# Patient Record
Sex: Male | Born: 1960 | Race: White | Hispanic: No | Marital: Married | Smoking: Never smoker
Health system: Southern US, Community
[De-identification: ages and names within clinical notes are randomized; demographics above are authoritative.]

## PROBLEM LIST (undated history)

## (undated) DIAGNOSIS — E669 Obesity, unspecified: Secondary | ICD-10-CM

## (undated) DIAGNOSIS — I10 Essential (primary) hypertension: Secondary | ICD-10-CM

## (undated) HISTORY — DX: Essential (primary) hypertension: I10

## (undated) HISTORY — DX: Obesity, unspecified: E66.9

---

## 1999-11-17 ENCOUNTER — Emergency Department (HOSPITAL_COMMUNITY): Admission: EM | Admit: 1999-11-17 | Discharge: 1999-11-18 | Payer: Self-pay | Admitting: Emergency Medicine

## 2008-01-03 HISTORY — PX: OTHER SURGICAL HISTORY: SHX169

## 2009-05-13 ENCOUNTER — Ambulatory Visit: Payer: Self-pay | Admitting: Family Medicine

## 2009-05-13 DIAGNOSIS — R635 Abnormal weight gain: Secondary | ICD-10-CM

## 2009-05-14 LAB — CONVERTED CEMR LAB
Albumin: 4.7 g/dL (ref 3.5–5.2)
Alkaline Phosphatase: 52 units/L (ref 39–117)
BUN: 20 mg/dL (ref 6–23)
Calcium: 9.1 mg/dL (ref 8.4–10.5)
Creatinine, Ser: 1.09 mg/dL (ref 0.40–1.50)
Glucose, Bld: 93 mg/dL (ref 70–99)
HDL: 35 mg/dL — ABNORMAL LOW (ref >39)
LDL Cholesterol: 133 mg/dL — ABNORMAL HIGH (ref 0–99)
Potassium: 4.1 meq/L (ref 3.5–5.3)
Total CHOL/HDL Ratio: 5.5
Triglycerides: 124 mg/dL (ref <150)

## 2010-02-01 NOTE — Assessment & Plan Note (Signed)
Summary: NOV weight/ labs   Vital Signs:  Patient profile:   50 year old male Height:      71 inches Weight:      265 pounds BMI:     37.09 O2 Sat:      97 % on Room air Pulse rate:   71 / minute BP sitting:   134 / 86  (left arm) Cuff size:   large  Vitals Entered By: Payton Spark CMA (May 13, 2009 9:50 AM)  O2 Flow:  Room air CC: New to est. Discuss weight loss and having fasting labs.    Primary Care Provider:  Seymour Bars DO  CC:  New to est. Discuss weight loss and having fasting labs. .  History of Present Illness: 50 yo WM presents for NOV.  He is previously healthy other than being overwt.  His last CPE was in 07.  His last Tetanus was < 10 yrs ago.  He has not had labs drawn in 4 yrs.  He had a stress test in 2000 that was normal due to fam hx of AMI.  He denies CP or DOE.  he reports a 100 # wt gain over 10 yrs, mostly after having to stop martial arts from a knee injury that required surgery.  he used to be a Optometrist.  Admits to a poor diet, esp with work travel, lack of exercise and a big appetite.    Current Medications (verified): 1)  None  Allergies (verified): No Known Drug Allergies  Past History:  Past Medical History: obese  Past Surgical History: L knee reconstruction 2010- Dr Rennis Chris  Family History: Father AMI at 69, living Mother HTN, anemia MGM DM 2 sisters - overwt  Social History: Art gallery manager for [employer]. Married, no kids. Never smoked. No ETOH. Used to play sports and do [employer]. Poor diet.    Review of Systems       no fevers/sweats/weakness, unexplained wt loss/gain, no change in vision, no difficulty hearing, ringing in ears, no hay fever/allergies, no CP/discomfort, no palpitations, no breast lump/nipple discharge, no cough/wheeze, no blood in stool, no N/V/D, no nocturia, no leaking urine, no unusual vag bleeding, no vaginal/penile discharge, no muscle/joint pain, no rash, no new/changing mole, no HA, no memory loss,  no anxiety, no sleep problem, no depression, no unexplained lumps, no easy bruising/bleeding, no concern with sexual function   Physical Exam  General:  overwt WM in NAD Head:  normocephalic, atraumatic, and no alopecia.   Eyes:  pupils equal, pupils round, and pupils reactive to light.   Nose:  no nasal discharge.   Mouth:  good dentition and pharynx pink and moist.   Neck:  no masses.   Lungs:  Normal respiratory effort, chest expands symmetrically. Lungs are clear to auscultation, no crackles or wheezes. Heart:  Normal rate and regular rhythm. S1 and S2 normal without gallop, murmur, click, rub or other extra sounds. Abdomen:  Bowel sounds positive,abdomen soft and non-tender without masses, organomegaly, no AA bruits Extremities:  no LE edema Neurologic:  gait normal.   Skin:  color normal.   Cervical Nodes:  No lymphadenopathy noted Psych:  good eye contact, not anxious appearing, and not depressed appearing.     Impression & Recommendations:  Problem # 1:  WEIGHT GAIN (ICD-783.1) 100 # wt gain over 10 yrs mostly due to change in lifestyle from being a competitive athlete in martial arts.  We discussed options for exercise that he can due given  his knee pain.  He agrees to start a program of exercise, 1 hr 5 days/ wk as goal and I have given him h/o on low carb, high protein healthy diet to follow.  Update fasting labs.  F/U in 4 wks to see how he is doing.   Orders: T-TSH [id number])  Other Orders: T-Comprehensive Metabolic Panel [id number]) T-Lipid Profile [id number])  Patient Instructions: 1)  Fasting labs today. 2)  Will call you w/ results tomorrow. 3)  Start High Protein Low carb diet.  Avoid snacking, highly sugared foods and processed foods. 4)  Work on getting 1 hr of exercise 5 days/ wk. 5)  Return for f/u weight in 4 wks.

## 2011-03-03 ENCOUNTER — Ambulatory Visit (INDEPENDENT_AMBULATORY_CARE_PROVIDER_SITE_OTHER): Payer: Self-pay | Admitting: Family Medicine

## 2011-03-03 ENCOUNTER — Encounter: Payer: Self-pay | Admitting: Family Medicine

## 2011-03-03 VITALS — BP 155/101 | HR 100 | Ht 71.0 in | Wt 272.0 lb

## 2011-03-03 DIAGNOSIS — E669 Obesity, unspecified: Secondary | ICD-10-CM

## 2011-03-03 DIAGNOSIS — I10 Essential (primary) hypertension: Secondary | ICD-10-CM

## 2011-03-03 MED ORDER — LISINOPRIL-HYDROCHLOROTHIAZIDE 20-12.5 MG PO TABS: 1.0000 | ORAL_TABLET | Freq: Every day | ORAL | Status: DC

## 2011-03-03 NOTE — Progress Notes (Signed)
  Subjective:    Patient ID: Terry Rojas, male    DOB: 29-Aug-1960, 51 y.o.   MRN: [id number]  Hypertension This is a chronic problem. The current episode started more than 1 year ago. The problem has been gradually worsening since onset. The problem is uncontrolled. Pertinent negatives include no chest pain, peripheral edema or shortness of breath. Agents associated with hypertension include NSAIDs. Risk factors for coronary artery disease include male gender, obesity and sedentary lifestyle. Compliance problems include exercise.    Here today bc BP has been high at home and otho office. Hi si likely having surgery on his right shoulder soon. He is to have blood pressure before when he has gained weight but typically will try to get his weight under control and his blood pressure will go down. He admits he is not very active and not exercising currently. He was particularly concerned because his diastolic was 120 the last time he went to see his orthopedist.   Review of Systems  Respiratory: Negative for shortness of breath.   Cardiovascular: Negative for chest pain.       Objective:   Physical Exam  Constitutional: He is oriented to person, place, and time. He appears well-developed and well-nourished.  HENT:  Head: Normocephalic and atraumatic.  Eyes: Conjunctivae are normal. Pupils are equal, round, and reactive to light.  Cardiovascular: Normal rate, regular rhythm and normal heart sounds.        No carotid or abdominal bruits.  Pulmonary/Chest: Effort normal and breath sounds normal.  Neurological: He is alert and oriented to person, place, and time.  Skin: Skin is warm and dry.  Psychiatric: He has a normal mood and affect. His behavior is normal.          Assessment & Plan:  Hypertension - new diagnosis. We will check lab work including a CMP, TSH and lipid panel. He is fasting today. I will then start him on lisinopril HCTZ. Follow up in one month to recheck blood pressure. We  discussed the risks and benefits of the medication including potential side effects. We also discussed the DASH diet and working on a low-salt diet of 1500 mg or less. Regular exercise and weight loss would make a big impact as well.   Obesity-He recently started Weight Watchers a couple weeks ago; this will help as well.

## 2011-03-03 NOTE — Patient Instructions (Signed)
Google the [employer] diet  (nih website)  1.5 Gram Low Sodium Diet A 1.5 gram sodium diet restricts the amount of sodium in the diet to no more than 1.5 g or 1500 mg daily. The American Heart Association recommends Americans over the age of 39 to consume no more than 1500 mg of sodium each day to reduce the risk of developing high blood pressure. Research also shows that limiting sodium may reduce heart attack and stroke risk. Many foods contain sodium for flavor and sometimes as a preservative. When the amount of sodium in a diet needs to be low, it is important to know what to look for when choosing foods and drinks. The following includes some information and guidelines to help make it easier for you to adapt to a low sodium diet. QUICK TIPS  Do not add salt to food.     Avoid convenience items and fast food.     Choose unsalted snack foods.     Buy lower sodium products, often labeled as "lower sodium" or "no salt added."     Check food labels to learn how much sodium is in 1 serving.     When eating at a restaurant, ask that your food be prepared with less salt or none, if possible.  READING FOOD LABELS FOR SODIUM INFORMATION The nutrition facts label is a good place to find how much sodium is in foods. Look for products with no more than 400 mg of sodium per serving. Remember that 1.5 g = 1500 mg. The food label may also list foods as:  Sodium-free: Less than 5 mg in a serving.     Very low sodium: 35 mg or less in a serving.     Low-sodium: 140 mg or less in a serving.     Light in sodium: 50% less sodium in a serving. For example, if a food that usually has 300 mg of sodium is changed to become light in sodium, it will have 150 mg of sodium.     Reduced sodium: 25% less sodium in a serving. For example, if a food that usually has 400 mg of sodium is changed to reduced sodium, it will have 300 mg of sodium.  CHOOSING FOODS Grains  Avoid: Salted crackers and snack items. Some  cereals, including instant hot cereals. Bread stuffing and biscuit mixes. Seasoned rice or pasta mixes.     Choose: Unsalted snack items. Low-sodium cereals, oats, puffed wheat and rice, shredded wheat. English muffins and bread. Pasta.  Meats  Avoid: Salted, canned, smoked, spiced, pickled meats, including fish and poultry. Bacon, ham, sausage, cold cuts, hot dogs, anchovies.     Choose: Low-sodium canned tuna and salmon. Fresh or frozen meat, poultry, and fish.  Dairy  Avoid: Processed cheese and spreads. Cottage cheese. Buttermilk and condensed milk. Regular cheese.     Choose: Milk. Low-sodium cottage cheese. Yogurt. Sour cream. Low-sodium cheese.  Fruits and Vegetables  Avoid: Regular canned vegetables. Regular canned tomato sauce and paste. Frozen vegetables in sauces. Olives. Rosita Fire. Relishes. Sauerkraut.     Choose: Low-sodium canned vegetables. Low-sodium tomato sauce and paste. Frozen or fresh vegetables. Fresh and frozen fruit.  Condiments  Avoid: Canned and packaged gravies. Worcestershire sauce. Tartar sauce. Barbecue sauce. Soy sauce. Steak sauce. Ketchup. Onion, garlic, and table salt. Meat flavorings and tenderizers.     Choose: Fresh and dried herbs and spices. Low-sodium varieties of mustard and ketchup. Lemon juice. Tabasco sauce. Horseradish.  SAMPLE 1.5 GRAM SODIUM MEAL  PLAN Breakfast / Sodium (mg)  1 cup low-fat milk / 143 mg     1 whole-wheat English muffin / 240 mg     1 tbs heart-healthy margarine / 153 mg     1 hard-boiled egg / 139 mg     1 small orange / 0 mg  Lunch / Sodium (mg)  1 cup raw carrots / 76 mg     2 tbs no salt added peanut butter / 5 mg     2 slices whole-wheat bread / 270 mg     1 tbs jelly / 6 mg      cup red grapes / 2 mg  Dinner / Sodium (mg)  1 cup whole-wheat pasta / 2 mg     1 cup low-sodium tomato sauce / 73 mg     3 oz lean ground beef / 57 mg     1 small side salad (1 cup raw spinach leaves,  cup cucumber,   cup yellow bell pepper) with 1 tsp olive oil and 1 tsp red wine vinegar / 25 mg  Snack / Sodium (mg)  1 container low-fat vanilla yogurt / 107 mg     3 graham cracker squares / 127 mg  Nutrient Analysis  Calories: 1745     Protein: 75 g     Carbohydrate: 237 g     Fat: 57 g     Sodium: 1425 mg  Document Released: 12/19/2004 Document Revised: 08/31/2010 Document Reviewed: 03/22/2009 [facility] Patient Information 2012 [place], Peter.

## 2011-03-06 LAB — COMPLETE METABOLIC PANEL WITH GFR
Albumin: 4.7 g/dL (ref 3.5–5.2)
BUN: 26 mg/dL — ABNORMAL HIGH (ref 6–23)
Calcium: 9.4 mg/dL (ref 8.4–10.5)
Chloride: 102 mEq/L (ref 96–112)
Creat: 1.04 mg/dL (ref 0.50–1.35)
GFR, Est Non African American: 84 mL/min
Glucose, Bld: 110 mg/dL — ABNORMAL HIGH (ref 70–99)
Potassium: 4.8 mEq/L (ref 3.5–5.3)

## 2011-03-06 LAB — LIPID PANEL: Cholesterol: 192 mg/dL (ref 0–200)

## 2011-03-14 HISTORY — PX: SHOULDER SURGERY: SHX246

## 2011-03-23 ENCOUNTER — Encounter: Payer: Self-pay | Admitting: *Deleted

## 2011-03-28 ENCOUNTER — Ambulatory Visit: Payer: BC Managed Care – PPO | Admitting: Family Medicine

## 2011-04-17 ENCOUNTER — Encounter: Payer: Self-pay | Admitting: *Deleted

## 2011-04-19 ENCOUNTER — Ambulatory Visit (INDEPENDENT_AMBULATORY_CARE_PROVIDER_SITE_OTHER): Payer: BC Managed Care – PPO | Admitting: Family Medicine

## 2011-04-19 ENCOUNTER — Encounter: Payer: Self-pay | Admitting: Family Medicine

## 2011-04-19 VITALS — BP 130/81 | HR 80 | Ht 71.0 in | Wt 269.0 lb

## 2011-04-19 DIAGNOSIS — I10 Essential (primary) hypertension: Secondary | ICD-10-CM

## 2011-04-19 MED ORDER — LISINOPRIL-HYDROCHLOROTHIAZIDE 20-12.5 MG PO TABS: 1.0000 | ORAL_TABLET | Freq: Every day | ORAL | Status: DC

## 2011-04-19 NOTE — Progress Notes (Signed)
  Subjective:    Patient ID: Terry Rojas, male    DOB: [birth year], 51 y.o.   MRN: [id number]  HPI HTN - BP doing well. Has check it at home.  BP in the 120s at home.  No CP or SOB.  NO SE on the medication. Dry cough initially but has resolved.  He bought the DASH diet book has dropped a few pounds. Just had shoulder surgery about 4 weeks ago.     Review of Systems     Objective:   Physical Exam  Constitutional: He is oriented to person, place, and time. He appears well-developed and well-nourished.  HENT:  Head: Normocephalic and atraumatic.  Cardiovascular: Normal rate, regular rhythm and normal heart sounds.   Pulmonary/Chest: Effort normal and breath sounds normal.  Neurological: He is alert and oriented to person, place, and time.  Skin: Skin is warm and dry.  Psychiatric: He has a normal mood and affect. His behavior is normal.          Assessment & Plan:  HTN- contorlle.d At goal. F/U in 6 months.  Check BMP today.  Doing well. rx sent to [facility]. Continue with dietary changes and exercise and weight loss.   He plans to schedule a CPE this summmer.

## 2011-04-20 LAB — BASIC METABOLIC PANEL
BUN: 22 mg/dL (ref 6–23)
CO2: 29 mEq/L (ref 19–32)
Chloride: 103 mEq/L (ref 96–112)
Glucose, Bld: 103 mg/dL — ABNORMAL HIGH (ref 70–99)
Potassium: 4.6 mEq/L (ref 3.5–5.3)

## 2011-04-20 NOTE — Progress Notes (Signed)
Quick Note:  All labs are normal. ______ 

## 2011-09-01 ENCOUNTER — Encounter: Payer: BC Managed Care – PPO | Admitting: Family Medicine

## 2011-09-09 ENCOUNTER — Other Ambulatory Visit: Payer: Self-pay | Admitting: Family Medicine

## 2011-11-03 ENCOUNTER — Other Ambulatory Visit: Payer: Self-pay | Admitting: Family Medicine

## 2011-11-03 NOTE — Telephone Encounter (Signed)
Needs appointment

## 2011-11-24 ENCOUNTER — Encounter: Payer: BC Managed Care – PPO | Admitting: Family Medicine

## 2011-11-24 DIAGNOSIS — Z0289 Encounter for other administrative examinations: Secondary | ICD-10-CM

## 2012-09-12 ENCOUNTER — Encounter: Payer: Self-pay | Admitting: Family Medicine

## 2012-09-12 ENCOUNTER — Ambulatory Visit (INDEPENDENT_AMBULATORY_CARE_PROVIDER_SITE_OTHER): Payer: BC Managed Care – PPO | Admitting: Family Medicine

## 2012-09-12 VITALS — BP 147/87 | HR 95 | Wt 282.0 lb

## 2012-09-12 DIAGNOSIS — I10 Essential (primary) hypertension: Secondary | ICD-10-CM

## 2012-09-12 DIAGNOSIS — Z1211 Encounter for screening for malignant neoplasm of colon: Secondary | ICD-10-CM

## 2012-09-12 DIAGNOSIS — Z6839 Body mass index (BMI) 39.0-39.9, adult: Secondary | ICD-10-CM

## 2012-09-12 MED ORDER — LISINOPRIL-HYDROCHLOROTHIAZIDE 20-12.5 MG PO TABS: ORAL_TABLET | ORAL | Status: DC

## 2012-09-12 NOTE — Progress Notes (Signed)
  Subjective:    Patient ID: Terry Rojas, male    DOB: [birth year], 52 y.o.   MRN: [id number]  HPI HTN -  Pt denies chest pain, SOB, dizziness, or heart palpitations.  Taking meds as directed w/o problems.  Denies medication side effects.  Plans on staring a workout program.  He has a new job and has been much less stressful but he does travel a lot which consist diet is often very poor.  BPs at home running 120-150s. No swelling  Review of Systems  BP 147/87  Pulse 95  Wt 282 lb (127.914 kg)  BMI 39.35 kg/m2    No Known Allergies  Past Medical History  Diagnosis Date  . Obese     Past Surgical History  Procedure Laterality Date  . Left knee reconstruction  2010    Dr. Rennis Chris  . Shoulder surgery  03/14/2011    left, meniscal tear repair.     History   Social History  . Marital Status: Single    Spouse Name: N/A    Number of Children: N/A  . Years of Education: N/A   Occupational History  . Not on file.   Social History Main Topics  . Smoking status: Never Smoker   . Smokeless tobacco: Not on file  . Alcohol Use: No  . Drug Use: No  . Sexual Activity:    Other Topics Concern  . Not on file   Social History Narrative  . No narrative on file    Family History  Problem Relation Age of Onset  . Coronary artery disease Father 64    6 vessel bypass  . Heart disease Father 24  . Hypertension Mother   . Anemia Mother   . Diabetes Maternal Grandmother     Outpatient Encounter Prescriptions as of 09/12/2012  Medication Sig Dispense Refill  . lisinopril-hydrochlorothiazide (PRINZIDE,ZESTORETIC) 20-12.5 MG per tablet One po BID  60 tablet  3  . [DISCONTINUED] lisinopril-hydrochlorothiazide (PRINZIDE,ZESTORETIC) 20-12.5 MG per tablet TAKE 1 TABLET DAILY (NEED APPOINTMENT)  30 tablet  0   No facility-administered encounter medications on file as of 09/12/2012.          Objective:   Physical Exam  Constitutional: He is oriented to person, place, and time. He  appears well-developed and well-nourished.  HENT:  Head: Normocephalic and atraumatic.  Cardiovascular: Normal rate, regular rhythm and normal heart sounds.   Pulmonary/Chest: Effort normal and breath sounds normal.  Neurological: He is alert and oriented to person, place, and time.  Skin: Skin is warm and dry.  Psychiatric: He has a normal mood and affect. His behavior is normal.          Assessment & Plan:  HTN - uncontrolled.  Discussed low salt diet, weight loss and regular exercise.  Increase med to BID. F/U in 6 weeks to make sure well controlled.   Obesity-he says is the most she has ever weighed. He does plan on starting a workout program with a personal trainer and is anxious or tracking his diet as of today.  Need for screenign colonoscopy. Discussed options.  He is okay with getting a screening colonoscopy. Referral placed.  Due for Flu vaccine.  He says he typically gets it through his wife's work.

## 2012-09-13 ENCOUNTER — Ambulatory Visit: Payer: BC Managed Care – PPO | Admitting: Family Medicine

## 2012-10-29 LAB — COMPLETE METABOLIC PANEL WITH GFR
AST: 31 U/L (ref 0–37)
Albumin: 4.3 g/dL (ref 3.5–5.2)
BUN: 16 mg/dL (ref 6–23)
Calcium: 9.3 mg/dL (ref 8.4–10.5)
Chloride: 102 mEq/L (ref 96–112)
Glucose, Bld: 115 mg/dL — ABNORMAL HIGH (ref 70–99)
Potassium: 4.2 mEq/L (ref 3.5–5.3)

## 2012-10-29 LAB — LIPID PANEL
Cholesterol: 190 mg/dL (ref 0–200)
LDL Cholesterol: 119 mg/dL — ABNORMAL HIGH (ref 0–99)
VLDL: 32 mg/dL (ref 0–40)

## 2012-11-01 ENCOUNTER — Ambulatory Visit (INDEPENDENT_AMBULATORY_CARE_PROVIDER_SITE_OTHER): Payer: BC Managed Care – PPO | Admitting: Family Medicine

## 2012-11-01 ENCOUNTER — Encounter: Payer: Self-pay | Admitting: Family Medicine

## 2012-11-01 VITALS — BP 146/95 | HR 95 | Ht 71.0 in | Wt 283.0 lb

## 2012-11-01 DIAGNOSIS — R7301 Impaired fasting glucose: Secondary | ICD-10-CM

## 2012-11-01 DIAGNOSIS — R7309 Other abnormal glucose: Secondary | ICD-10-CM

## 2012-11-01 DIAGNOSIS — I10 Essential (primary) hypertension: Secondary | ICD-10-CM

## 2012-11-01 LAB — BASIC METABOLIC PANEL WITH GFR
BUN: 21 mg/dL (ref 6–23)
Chloride: 103 mEq/L (ref 96–112)
GFR, Est Non African American: 80 mL/min
Potassium: 4.1 mEq/L (ref 3.5–5.3)

## 2012-11-01 LAB — POCT GLYCOSYLATED HEMOGLOBIN (HGB A1C): Hemoglobin A1C: 5.6

## 2012-11-01 NOTE — Patient Instructions (Signed)
Diets for Diabetes, Food Labeling Look at food labels to help you decide how much of a product you can eat. You will want to check the amount of total carbohydrate in a serving to see how the food fits into your meal plan. In the list of ingredients, the ingredient present in the largest amount by weight must be listed first, followed by the other ingredients in descending order. STANDARD OF IDENTITY Most products have a list of ingredients. However, foods that the Food and Drug Administration (FDA) has given a standard of identity do not need a list of ingredients. A standard of identity means that a food must contain certain ingredients if it is called a particular name. Examples are mayonnaise, peanut butter, ketchup, jelly, and cheese. LABELING TERMS There are many terms found on food labels. Some of these terms have specific definitions. Some terms are regulated by the FDA, and the FDA has clearly specified how they can be used. Others are not regulated or well-defined and can be misleading and confusing. SPECIFICALLY DEFINED TERMS Nutritive Sweetener.  A sweetener that contains calories,such as table sugar or honey. Nonnutritive Sweetener.  A sweetener with few or no calories,such as saccharin, aspartame, sucralose, and cyclamate. LABELING TERMS REGULATED BY THE FDA Free.  The product contains only a tiny or small amount of fat, cholesterol, sodium, sugar, or calories. For example, a "fat-free" product will contain less than 0.5 g of fat per serving. Low.  A food described as "low" in fat, saturated fat, cholesterol, sodium, or calories could be eaten fairly often without exceeding dietary guidelines. For example, "low in fat" means no more than 3 g of fat per serving. Lean.  "Lean" and "extra lean" are U.S. Department of Agriculture (USDA) terms for use on meat and poultry products. "Lean" means the product contains less than 10 g of fat, 4 g of saturated fat, and 95 mg of cholesterol  per serving. "Lean" is not as low in fat as a product labeled "low." Extra Lean.  "Extra lean" means the product contains less than 5 g of fat, 2 g of saturated fat, and 95 mg of cholesterol per serving. While "extra lean" has less fat than "lean," it is still higher in fat than a product labeled "low." Reduced, Less, Fewer.  A diet product that contains 25% less of a nutrient or calories than the regular version. For example, hot dogs might be labeled "25% less fat than our regular hot dogs." Light/Lite.  A diet product that contains  fewer calories or  the fat of the original. For example, "light in sodium" means a product with  the usual sodium. More.  One serving contains at least 10% more of the daily value of a vitamin, mineral, or fiber than usual. Good Source Of.  One serving contains 10% to 19% of the daily value for a particular vitamin, mineral, or fiber. Excellent Source Of.  One serving contains 20% or more of the daily value for a particular nutrient. Other terms used might be "high in" or "rich in." Enriched or Fortified.  The product contains added vitamins, minerals, or protein. Nutrition labeling must be used on enriched or fortified foods. Imitation.  The product has been altered so that it is lower in protein, vitamins, or minerals than the usual food,such as imitation peanut butter. Total Fat.  The number listed is the total of all fat found in a serving of the product. Under total fat, food labels must list saturated fat and   trans fat, which are associated with raising bad cholesterol and an increased risk of heart blood vessel disease. Saturated Fat.  Mainly fats from animal-based sources. Some examples are red meat, cheese, cream, whole milk, and coconut oil. Trans Fat.  Found in some fried snack foods, packaged foods, and fried restaurant foods. It is recommended you eat as close to 0 g of trans fat as possible, since it raises bad cholesterol and lowers  good cholesterol. Polyunsaturated and Monounsaturated Fats.  More healthful fats. These fats are from plant sources. Total Carbohydrate.  The number of carbohydrate grams in a serving of the product. Under total carbohydrate are listed the other carbohydrate sources, such as dietary fiber and sugars. Dietary Fiber.  A carbohydrate from plant sources. Sugars.  Sugars listed on the label contain all naturally occurring sugars as well as added sugars. LABELING TERMS NOT REGULATED BY THE FDA Sugarless.  Table sugar (sucrose) has not been added. However, the manufacturer may use another form of sugar in place of sucrose to sweeten the product. For example, sugar alcohols are used to sweeten foods. Sugar alcohols are a form of sugar but are not table sugar. If a product contains sugar alcohols in place of sucrose, it can still be labeled "sugarless." Low Salt, Salt-Free, Unsalted, No Salt, No Salt Added, Without Added Salt.  Food that is usually processed with salt has been made without salt. However, the food may contain sodium-containing additives, such as preservatives, leavening agents, or flavorings. Natural.  This term has no legal meaning. Organic.  Foods that are certified as organic have been inspected and approved by the USDA to ensure they are produced without pesticides, fertilizers containing synthetic ingredients, bioengineering, or ionizing radiation. Document Released: 12/22/2002 Document Revised: 03/13/2011 Document Reviewed: 07/09/2008 ExitCare Patient Information 2014 [place], [place]. 1800 Calorie Diet for Diabetes Meal Planning The 1800 calorie diet is designed for eating up to 1800 calories each day. Following this diet and making healthy meal choices can help improve overall health. This diet controls blood sugar (glucose) levels and can also help lower blood pressure and cholesterol. SERVING SIZES Measuring foods and serving sizes helps to make sure you are getting the  right amount of food. The list below tells how big or small some common serving sizes are:  1 oz.........4 stacked dice.  3 oz........Marland KitchenDeck of cards.  1 tsp.......Marland KitchenTip of little finger.  1 tbs......Marland KitchenMarland KitchenThumb.  2 tbs.......Marland KitchenGolf ball.   cup......Marland KitchenHalf of a fist.  1 cup.......Marland KitchenA fist. GUIDELINES FOR CHOOSING FOODS The goal of this diet is to eat a variety of foods and limit calories to 1800 each day. This can be done by choosing foods that are low in calories and fat. The diet also suggests eating small amounts of food frequently. Doing this helps control your blood glucose levels so they do not get too high or too low. Each meal or snack may include a protein food source to help you feel more satisfied and to stabilize your blood glucose. Try to eat about the same amount of food around the same time each day. This includes weekend days, travel days, and days off work. Space your meals about 4 to 5 hours apart and add a snack between them if you wish.  For example, a daily food plan could include breakfast, a morning snack, lunch, dinner, and an evening snack. Healthy meals and snacks include whole grains, vegetables, fruits, lean meats, poultry, fish, and dairy products. As you plan your meals, select a variety of foods.  Choose from the bread and starch, vegetable, fruit, dairy, and meat/protein groups. Examples of foods from each group and their suggested serving sizes are listed below. Use measuring cups and spoons to become familiar with what a healthy portion looks like. Bread and Starch Each serving equals 15 grams of carbohydrates.  1 slice bread.   bagel.   cup cold cereal (unsweetened).   cup hot cereal or mashed potatoes.  1 small potato (size of a computer mouse).   cup cooked pasta or rice.   English muffin.  1 cup broth-based soup.  3 cups of popcorn.  4 to 6 whole-wheat crackers.   cup cooked beans, peas, or corn. Vegetable Each serving equals 5 grams of  carbohydrates.   cup cooked vegetables.  1 cup raw vegetables.   cup tomato or vegetable juice. Fruit Each serving equals 15 grams of carbohydrates.  1 small apple or orange.  1 cup watermelon or strawberries.   cup applesauce (no sugar added).  2 tbs raisins.   banana.   cup canned fruit, packed in water, its own juice, or sweetened with a sugar substitute.   cup unsweetened fruit juice. Dairy Each serving equals 12 to 15 grams of carbohydrates.  1 cup fat-free milk.  6 oz artificially sweetened yogurt or plain yogurt.  1 cup low-fat buttermilk.  1 cup soy milk.  1 cup almond milk. Meat/Protein  1 large egg.  2 to 3 oz meat, poultry, or fish.   cup low-fat cottage cheese.  1 tbs peanut butter.  1 oz low-fat cheese.   cup tuna in water.   cup tofu. Fat  1 tsp oil.  1 tsp trans-fat-free margarine.  1 tsp butter.  1 tsp mayonnaise.  2 tbs avocado.  1 tbs salad dressing.  1 tbs cream cheese.  2 tbs sour cream. SAMPLE 1800 CALORIE DIET PLAN Breakfast   cup unsweetened cereal (1 carb serving).  1 cup fat-free milk (1 carb serving).  1 slice whole-wheat toast (1 carb serving).   small banana (1 carb serving).  1 scrambled egg.  1 tsp trans-fat-free margarine. Lunch  Tuna sandwich.  2 slices whole-wheat bread (2 carb servings).   cup canned tuna in water, drained.  1 tbs reduced fat mayonnaise.  1 stalk celery, chopped.  2 slices tomato.  1 lettuce leaf.  1 cup carrot sticks.  24 to 30 seedless grapes (2 carb servings).  6 oz light yogurt (1 carb serving). Afternoon Snack  3 graham cracker squares (1 carb serving).  Fat-free milk, 1 cup (1 carb serving).  1 tbs peanut butter. Dinner  3 oz salmon, broiled with 1 tsp oil.  1 cup mashed potatoes (2 carb servings) with 1 tsp trans-fat-free margarine.  1 cup fresh or frozen green beans.  1 cup steamed asparagus.  1 cup fat-free milk (1 carb  serving). Evening Snack  3 cups air-popped popcorn (1 carb serving).  2 tbs parmesan cheese sprinkled on top. MEAL PLAN Use this worksheet to help you make a daily meal plan based on the 1800 calorie diet suggestions. If you are using this plan to help you control your blood glucose, you may interchange carbohydrate-containing foods (dairy, starches, and fruits). Select a variety of fresh foods of varying colors and flavors. The total amount of carbohydrate in your meals or snacks is more important than making sure you include all of the food groups every time you eat. Choose from the following foods to build your day's meals:  8  Starches.  4 Vegetables.  3 Fruits.  2 Dairy.  6 to 7 oz Meat/Protein.  Up to 4 Fats. Your dietician can use this worksheet to help you decide how many servings and which types of foods are right for you. BREAKFAST Food Group and Servings / Food Choice Starch ________________________________________________________ Dairy _________________________________________________________ Fruit _________________________________________________________ Meat/Protein __________________________________________________ Fat ___________________________________________________________ LUNCH Food Group and Servings / Food Choice Starch ________________________________________________________ Meat/Protein __________________________________________________ Vegetable _____________________________________________________ Fruit _________________________________________________________ Dairy _________________________________________________________ Fat ___________________________________________________________ Aura Fey Food Group and Servings / Food Choice Starch ________________________________________________________ Meat/Protein __________________________________________________ Fruit __________________________________________________________ Dairy  _________________________________________________________ Laural Golden Food Group and Servings / Food Choice Starch _________________________________________________________ Meat/Protein ___________________________________________________ Dairy __________________________________________________________ Vegetable ______________________________________________________ Fruit ___________________________________________________________ Fat ____________________________________________________________ Lollie Sails Food Group and Servings / Food Choice Fruit __________________________________________________________ Meat/Protein ___________________________________________________ Dairy __________________________________________________________ Starch _________________________________________________________ DAILY TOTALS Starch ____________________________ Vegetable _________________________ Fruit _____________________________ Dairy _____________________________ Meat/Protein______________________ Fat _______________________________ Document Released: 07/11/2004 Document Revised: 03/13/2011 Document Reviewed: 11/04/2010 ExitCare Patient Information 2014 [place] [employer].

## 2012-11-01 NOTE — Progress Notes (Signed)
  Subjective:    Patient ID: Terry Rojas, male    DOB: 1960-02-13, 52 y.o.   MRN: [id number]  HPI HTN -  Pt denies chest pain, SOB, dizziness, or heart palpitations.  Taking meds as directed w/o problems.  Denies medication side effects. Using an omron cuff.  Home BPs running under 140/90.    pt took bp this am it was 135/88   Abnormal glucose: Blastoma did blood work on him his glucose was elevated. He has not had any symptoms of polyuria or polydipsia. Never had any palms of his glucose in the past. He does admit he has been eating a little bit more carb heavy and cleaner protein diet lately.  Review of Systems     Objective:   Physical Exam  Constitutional: He is oriented to person, place, and time. He appears well-developed and well-nourished.  HENT:  Head: Normocephalic and atraumatic.  Cardiovascular: Normal rate, regular rhythm and normal heart sounds.   Pulmonary/Chest: Effort normal and breath sounds normal.  Neurological: He is alert and oriented to person, place, and time.  Skin: Skin is warm and dry.  Psychiatric: He has a normal mood and affect. His behavior is normal.          Assessment & Plan:  HTN - well controlled. F/Uin 6 months.  Had cough initially but now resolved. Check BMP today to check on creatinine and renal function since he does adjust his lisinopril HCT last time.  Impaired fasting glucose-new diagnosis. Discussed his diagnosis and what that means. Discussed avoiding concentrated sweets and cutting back on portion sizes and carbohydrates. Handout given. Also encouraged him to go on to the American diabetic Association website for additional nutritional information. We'll see him back in 6 months and we can follow his A1c carefully. Continue with his diet changes as well as exercise. He says he plans on starting to use my fitness PAL to help him continue to lose weight.

## 2012-11-01 NOTE — Progress Notes (Signed)
Quick Note:  All labs are normal. ______ 

## 2012-11-01 NOTE — Addendum Note (Signed)
Addended by: Deno Etienne on: 11/01/2012 09:46 AM   Modules accepted: Orders

## 2013-03-11 ENCOUNTER — Other Ambulatory Visit: Payer: Self-pay | Admitting: Family Medicine

## 2013-04-17 ENCOUNTER — Other Ambulatory Visit: Payer: Self-pay | Admitting: Family Medicine

## 2013-04-25 ENCOUNTER — Encounter: Payer: Self-pay | Admitting: Family Medicine

## 2013-04-25 ENCOUNTER — Ambulatory Visit (INDEPENDENT_AMBULATORY_CARE_PROVIDER_SITE_OTHER): Payer: BC Managed Care – PPO | Admitting: Family Medicine

## 2013-04-25 VITALS — BP 140/94 | HR 65 | Ht 71.0 in | Wt 290.0 lb

## 2013-04-25 DIAGNOSIS — G4710 Hypersomnia, unspecified: Secondary | ICD-10-CM

## 2013-04-25 DIAGNOSIS — R0989 Other specified symptoms and signs involving the circulatory and respiratory systems: Secondary | ICD-10-CM

## 2013-04-25 DIAGNOSIS — I10 Essential (primary) hypertension: Secondary | ICD-10-CM

## 2013-04-25 DIAGNOSIS — G4730 Sleep apnea, unspecified: Secondary | ICD-10-CM

## 2013-04-25 DIAGNOSIS — R0683 Snoring: Secondary | ICD-10-CM

## 2013-04-25 DIAGNOSIS — R0609 Other forms of dyspnea: Secondary | ICD-10-CM

## 2013-04-25 MED ORDER — LISINOPRIL-HYDROCHLOROTHIAZIDE 20-12.5 MG PO TABS: ORAL_TABLET | ORAL | Status: DC

## 2013-04-25 NOTE — Progress Notes (Signed)
STOP BANG: pt answered yes to all 8 questions. He is High risk for OSA.Terry Rojas L Terry Rojas

## 2013-04-25 NOTE — Progress Notes (Signed)
   Subjective:    Patient ID: Terry Rojas, male    DOB: 07/22/1960, 53 y.o.   MRN: [id number]  HPI Thinks may have sleep apnea.  He has been commuting back and forth to [place] as has had to stop and take naps.  .Snores nightly . Has been very fatigued. He has been gaining weight.    His wife has witnessed apneas. He has several family members that are not formally diagnosed with sleep apnea but he thinks they may have it.  Went to a bariatric seminar about a year ago. At that time he wasn't ready to commit to but now has decided he really would like to consider undergoing bariatric surgery. He just feels very uncomfortable in his body. Not able to exercise like he would like. 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 His wife is here with him for the visit today. He did check with your insurance and they will need to have 6 office visits for weight loss before he can be considered.  Hypertension- Pt denies chest pain, SOB, dizziness, or heart palpitations.  Taking meds as directed w/o problems.  Denies medication side effects.     Review of Systems     Objective:   Physical Exam  Constitutional: He is oriented to person, place, and time. He appears well-developed and well-nourished.  HENT:  Head: Normocephalic and atraumatic.  Eyes: Conjunctivae are normal. Pupils are equal, round, and reactive to light.  Neck: Neck supple. No thyromegaly present.  Cardiovascular: Normal rate, regular rhythm and normal heart sounds.   Pulmonary/Chest: Effort normal and breath sounds normal.  Musculoskeletal: He exhibits no edema.  Lymphadenopathy:    He has no cervical adenopathy.  Neurological: He is alert and oriented to person, place, and time.  Skin: Skin is warm and dry.  Psychiatric: He has a normal mood and affect. His behavior is normal.          Assessment & Plan:  Snoring-strong suspect he has sleep apnea. His stopping  questionnaire was positive for every question. [facility] Once we get the results and he will need to come back in to discuss those with an office visit. In the meantime make sure he is driving safely.  Obesity/BMI of 40-am happy to refer him for bariatric surgery. I did actually think he would be a good candidate. As far as the 6 months of monitor weight loss that can certainly be done at the bariatric clinic or here in our office. I be happy to do that for him.  Hypertension-mildly elevated not maximally controlled today. Usually his blood pressure looks great. I'm going to see him back hopefully in about one month after his sleep study so we can recheck his pressure at that time and make adjustments as needed. Refills were sent to the pharmacy today. I think also treating his sleep apnea will make a big difference in controlling his blood pressure as well.  Time spent 25 minutes, crit to person the time spent counseling about possible sleep apnea, obesity and blood pressure.

## 2013-04-29 ENCOUNTER — Ambulatory Visit: Payer: BC Managed Care – PPO | Admitting: Family Medicine

## 2013-05-08 ENCOUNTER — Telehealth: Payer: Self-pay | Admitting: *Deleted

## 2013-05-08 NOTE — Telephone Encounter (Signed)
lvm informing pt that he will need to call the Bariatric Seminar to begin his bariatric process.Deno Etienneonya L Anasha Perfecto

## 2013-05-08 NOTE — Telephone Encounter (Signed)
Message copied by Deno EtienneBARKLEY, Girl Schissler L on Thu May 08, 2013  9:43 AM ------      Message from: Kerrin ChampagneLOWERY, TENA M      Created: Fri Apr 25, 2013  4:42 PM       This pt needs to call the Bariatric Seminar at [phone] to start the process before we can schedule an appt. Please call me if any questions.      Tena ------

## 2013-05-30 ENCOUNTER — Other Ambulatory Visit: Payer: Self-pay | Admitting: Family Medicine

## 2013-07-07 ENCOUNTER — Other Ambulatory Visit: Payer: Self-pay | Admitting: Family Medicine

## 2013-07-07 NOTE — Telephone Encounter (Signed)
30 day refill sent to pharmacy with comment to schedule appt.

## 2013-08-10 ENCOUNTER — Other Ambulatory Visit: Payer: Self-pay | Admitting: Family Medicine

## 2013-08-17 ENCOUNTER — Encounter (HOSPITAL_BASED_OUTPATIENT_CLINIC_OR_DEPARTMENT_OTHER): Payer: BC Managed Care – PPO

## 2013-10-04 ENCOUNTER — Other Ambulatory Visit: Payer: Self-pay | Admitting: Family Medicine

## 2013-10-25 ENCOUNTER — Other Ambulatory Visit: Payer: Self-pay | Admitting: Family Medicine

## 2013-12-04 ENCOUNTER — Ambulatory Visit: Payer: Self-pay | Admitting: Specialist

## 2013-12-07 ENCOUNTER — Other Ambulatory Visit: Payer: Self-pay | Admitting: Family Medicine

## 2014-01-02 HISTORY — PX: BARIATRIC SURGERY: SHX1103

## 2014-01-19 ENCOUNTER — Other Ambulatory Visit: Payer: Self-pay | Admitting: Family Medicine

## 2014-01-19 NOTE — Telephone Encounter (Signed)
Pt needs appt before future refills.

## 2014-02-17 ENCOUNTER — Other Ambulatory Visit: Payer: Self-pay | Admitting: Family Medicine

## 2014-03-18 ENCOUNTER — Other Ambulatory Visit: Payer: Self-pay | Admitting: Family Medicine

## 2014-03-18 NOTE — Telephone Encounter (Signed)
Pt needs an office visit before further refills

## 2014-03-26 ENCOUNTER — Encounter: Payer: Self-pay | Admitting: Family Medicine

## 2014-03-26 DIAGNOSIS — Z9884 Bariatric surgery status: Secondary | ICD-10-CM | POA: Insufficient documentation

## 2014-04-17 ENCOUNTER — Other Ambulatory Visit: Payer: Self-pay | Admitting: Family Medicine

## 2014-05-17 ENCOUNTER — Other Ambulatory Visit: Payer: Self-pay | Admitting: Family Medicine

## 2015-04-12 IMAGING — CR DG CHEST 2V
1 series · 2 of 2 positions shown · non-contrast
Comparison: None.

CLINICAL DATA: Obesity, preop for gastric sleeve

EXAM:
CHEST  2 VIEW

[Series 1: dxr chest pa (or ap) and lateral · 0.14mm/px · 2 of 2 slices shown]
[im 1/2]
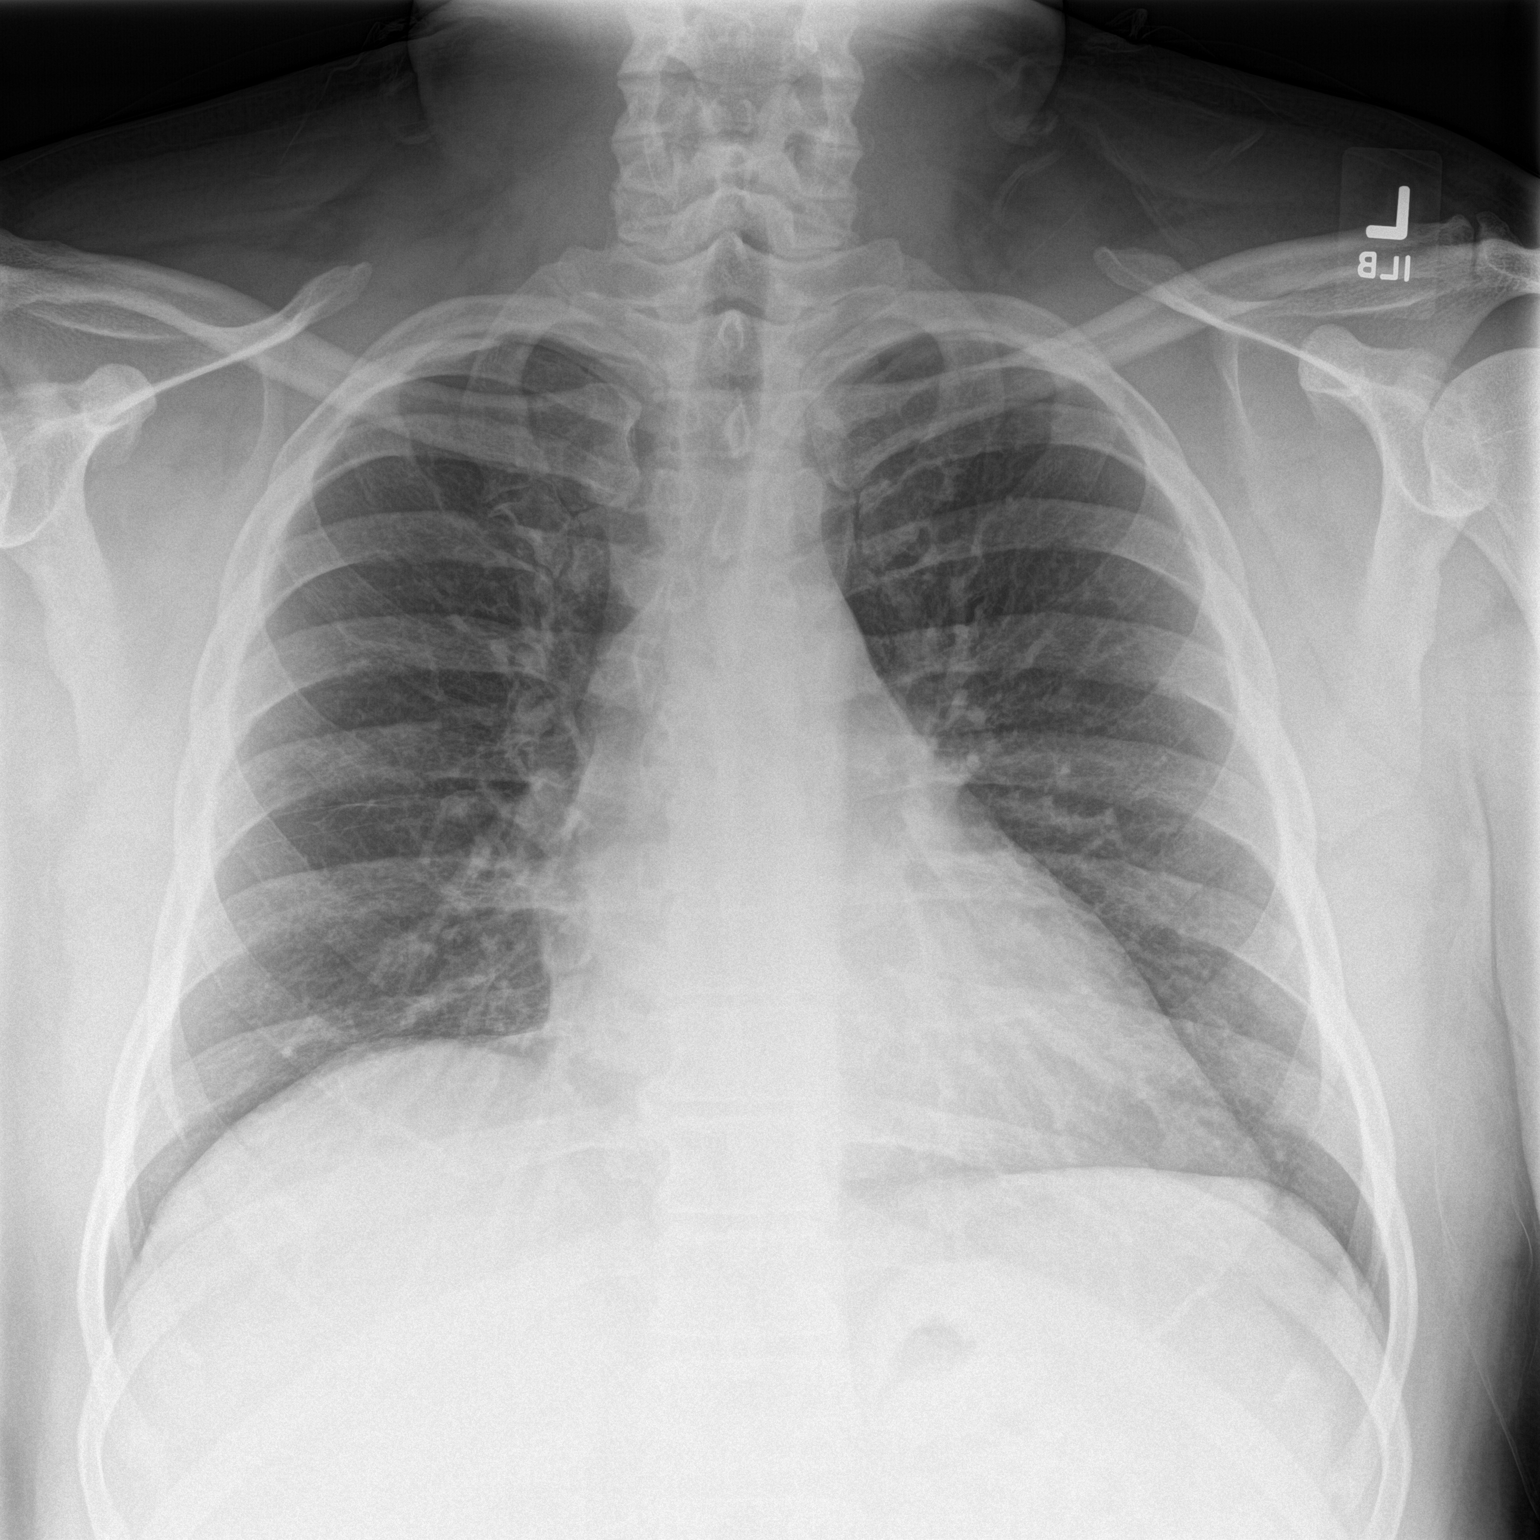
[im 2/2]
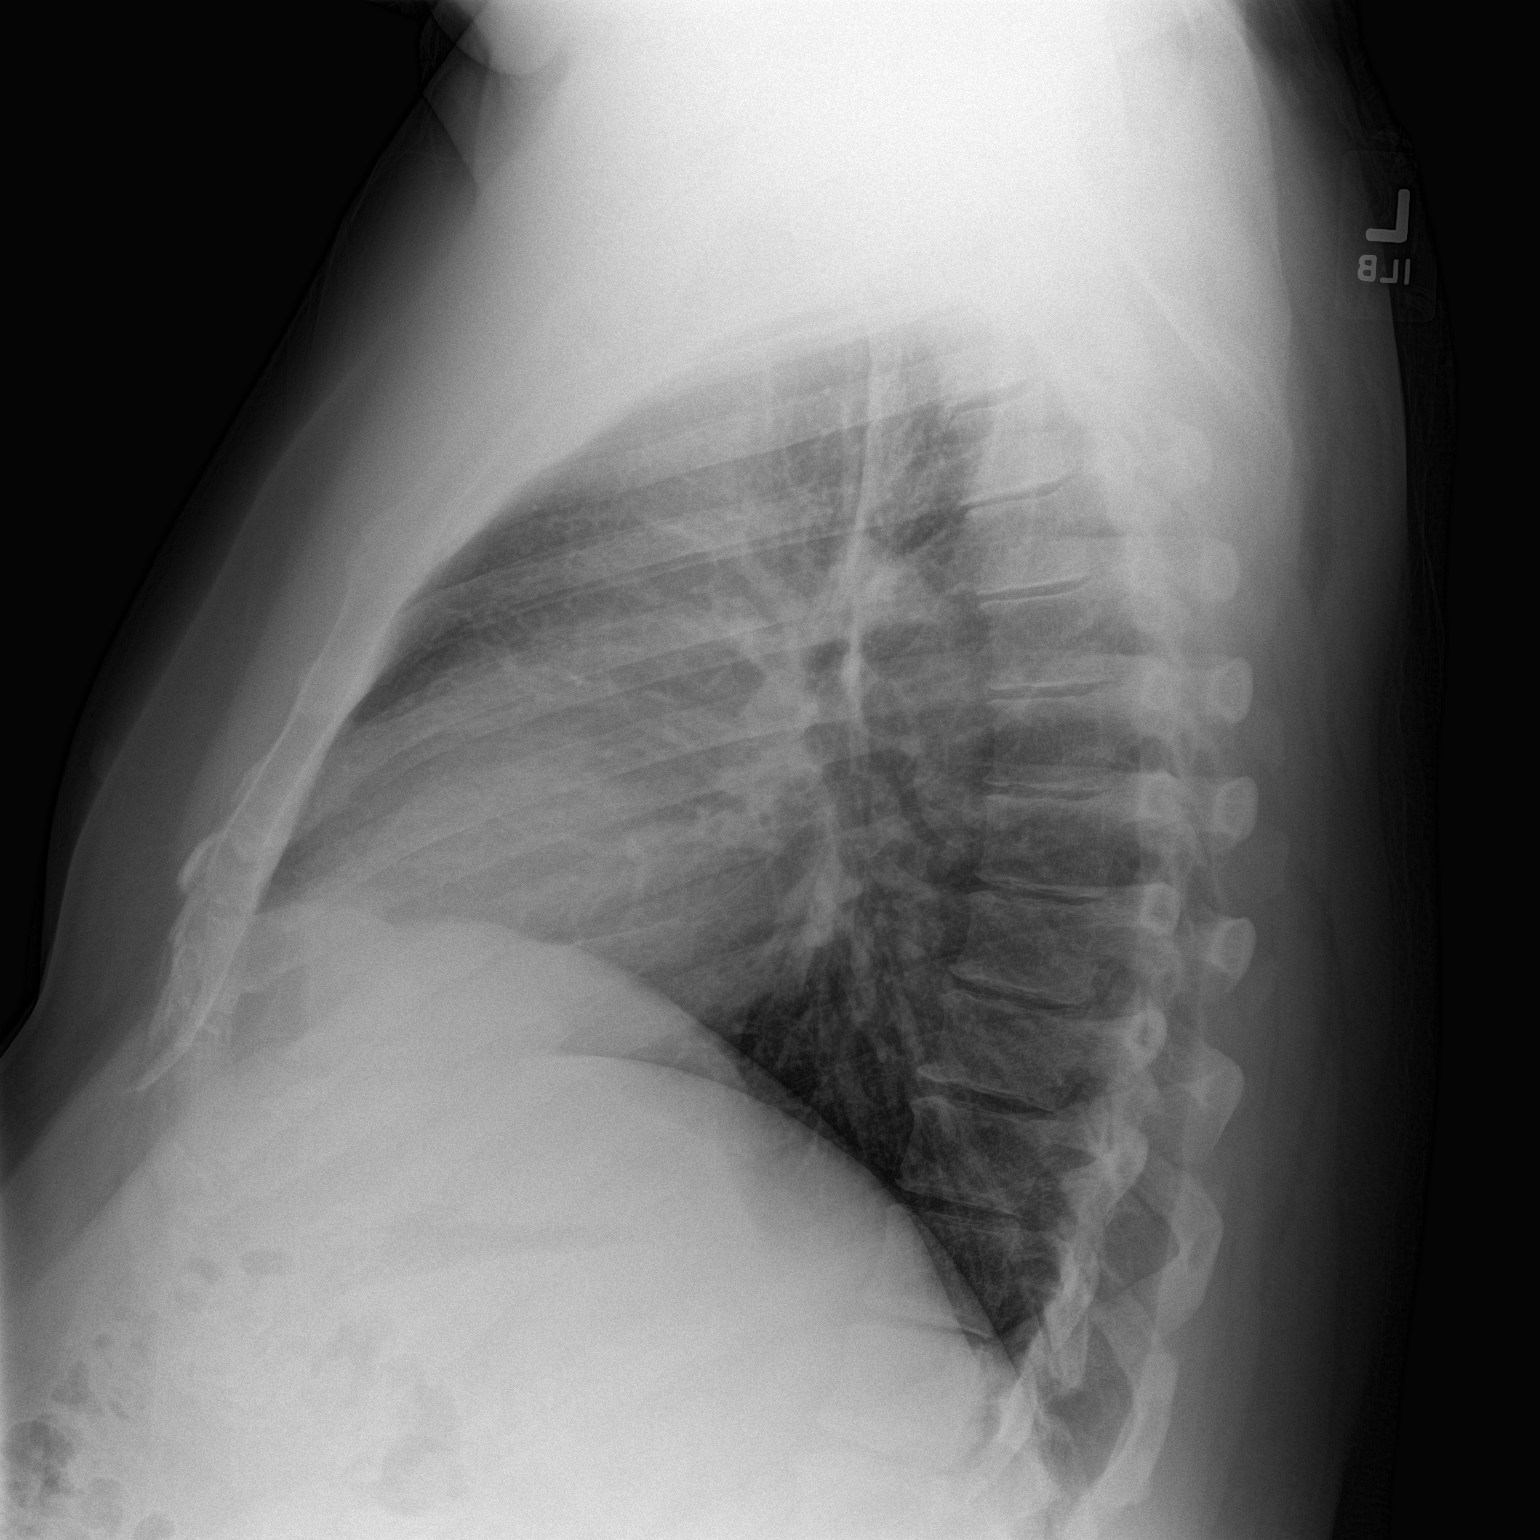

[2 of 2 positions shown; findings below may reference images not displayed]

FINDINGS: Cardiomediastinal silhouette is unremarkable. No acute infiltrate or
pleural effusion. No pulmonary edema. Mild elevation of the right
hemidiaphragm.
IMPRESSION: No active cardiopulmonary disease.

## 2015-04-12 IMAGING — US ABDOMEN ULTRASOUND LIMITED
1 series · 14 of 25 positions shown · non-contrast
Comparison: None.

CLINICAL DATA: Obesity, preop clearance

EXAM:
US ABDOMEN LIMITED - RIGHT UPPER QUADRANT

[Series 1: abdomen ultrasound limited · 0.33mm/px · 14 of 44 slices shown]
[im 1/44]
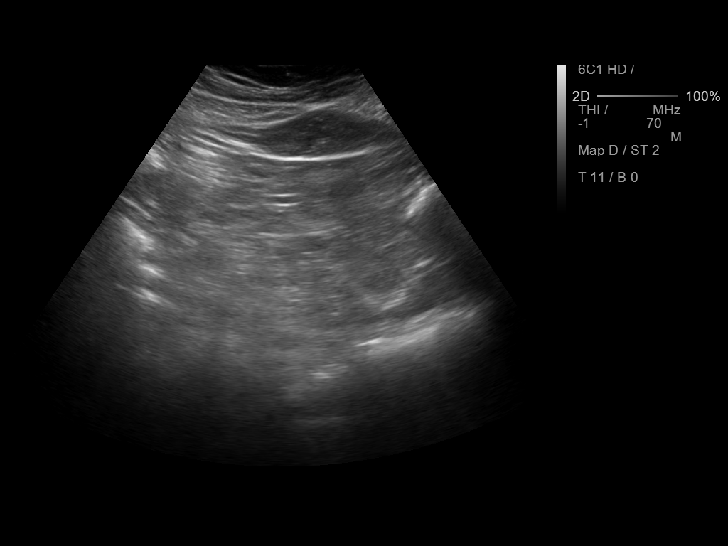
[im 4/44]
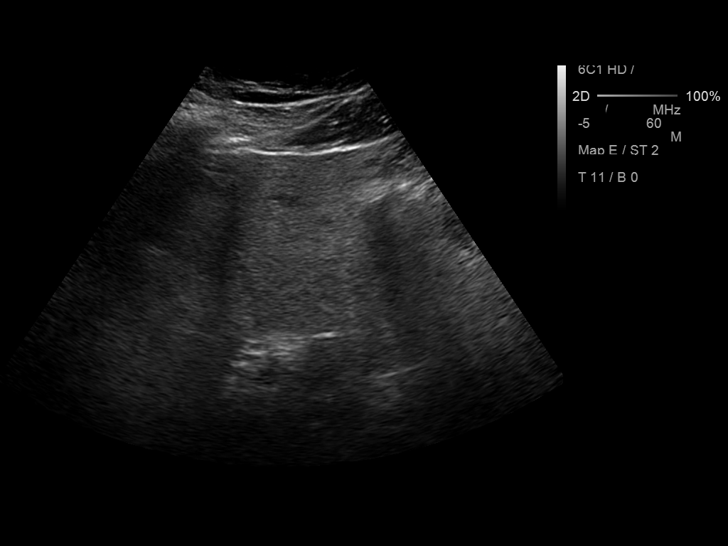
[im 8/44]
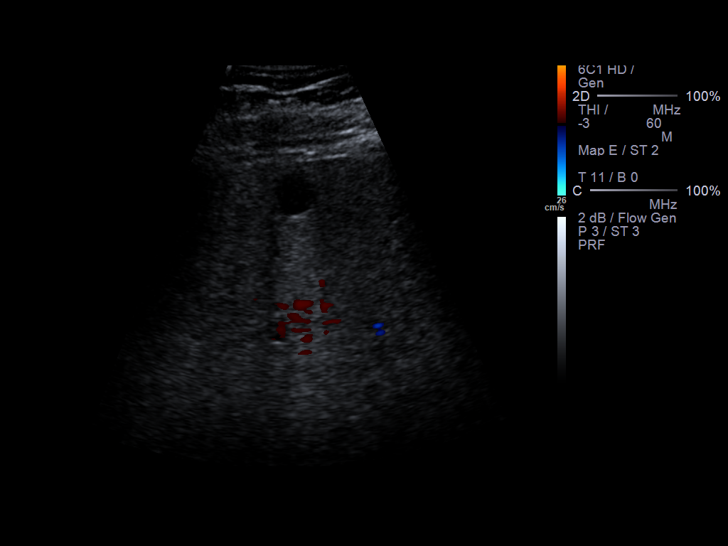
[im 11/44]
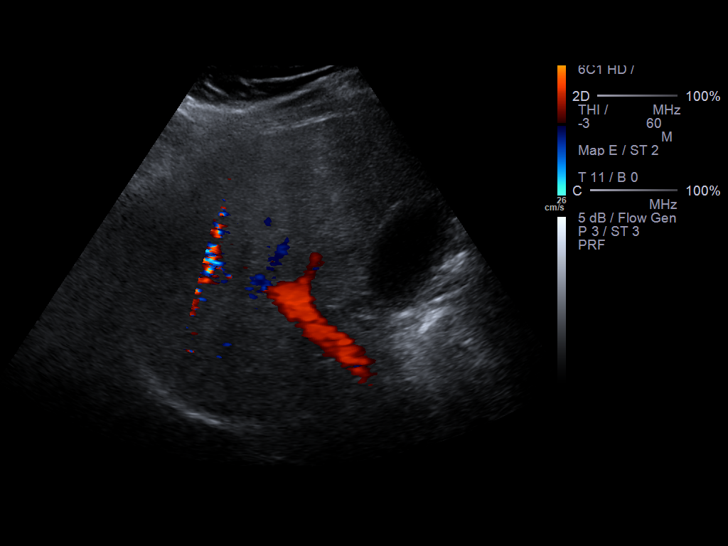
[im 15/44]
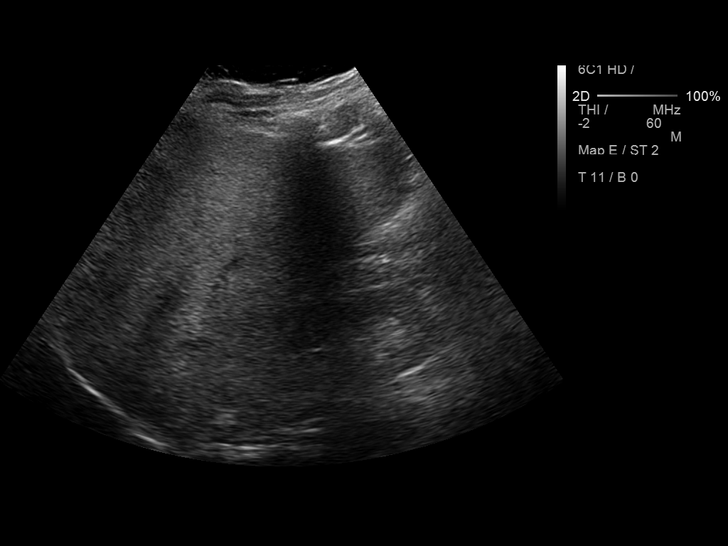
[im 17/44]
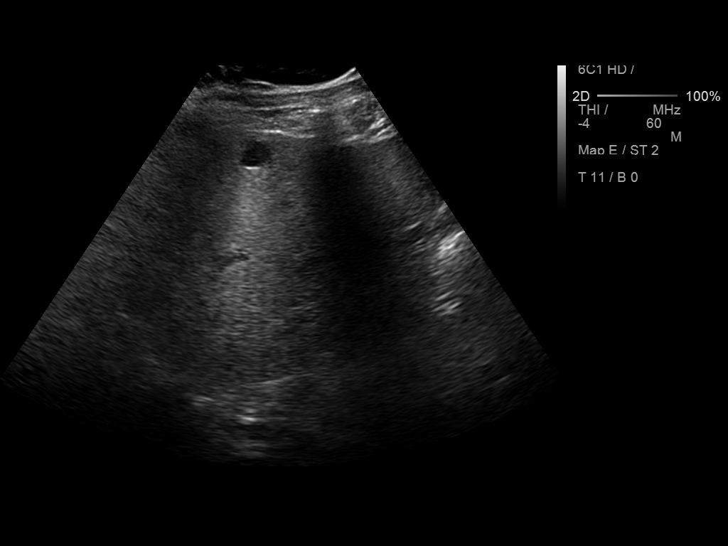
[im 20/44]
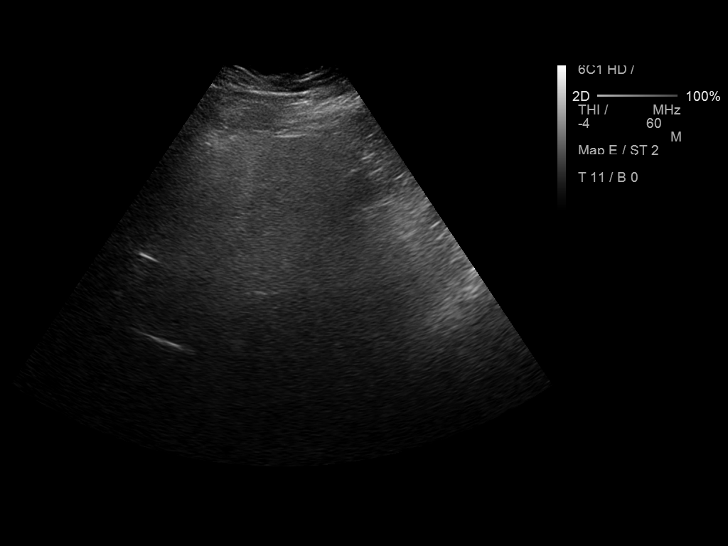
[im 24/44]
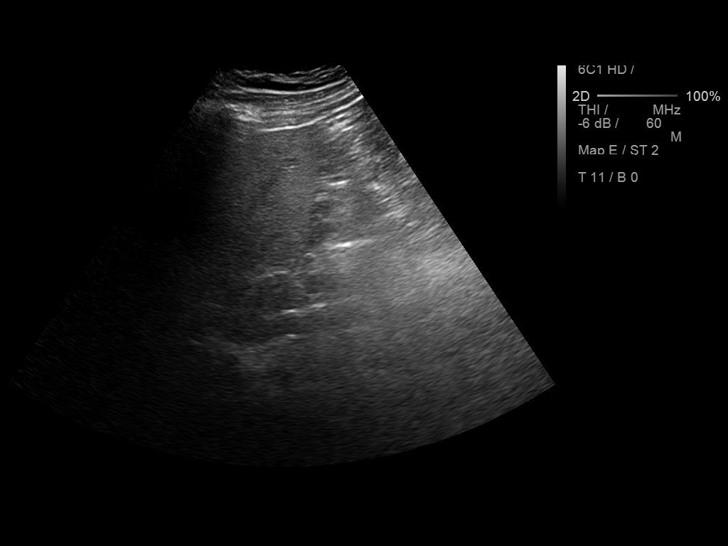
[im 27/44]
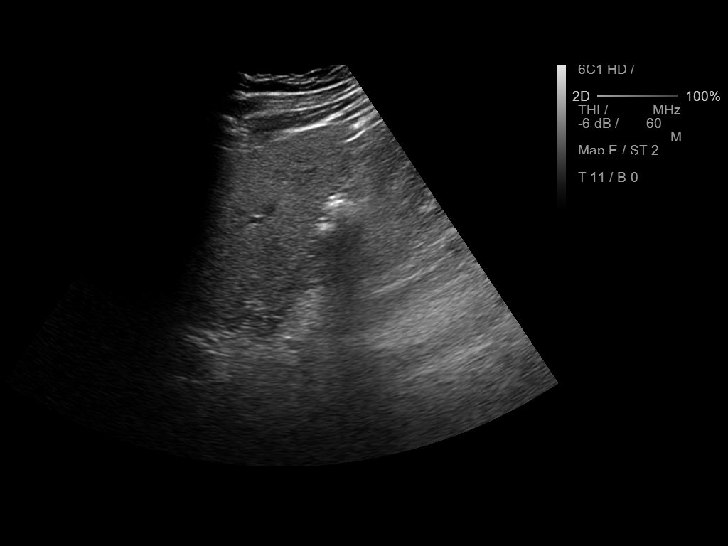
[im 29/44]
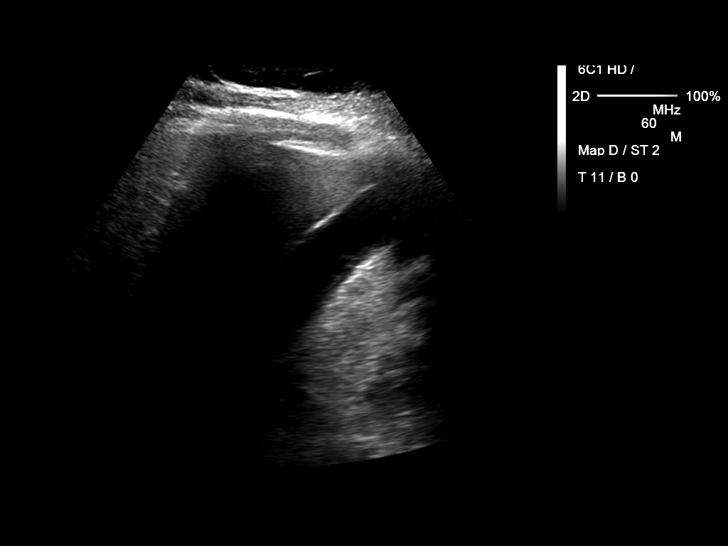
[im 33/44]
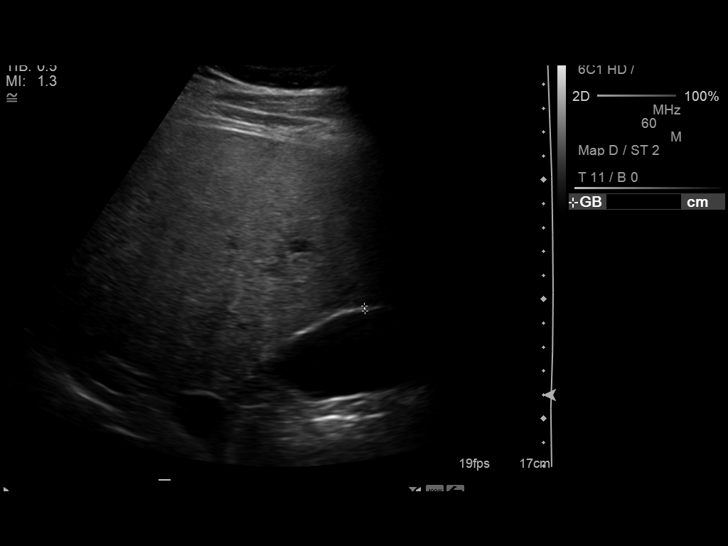
[im 36/44]
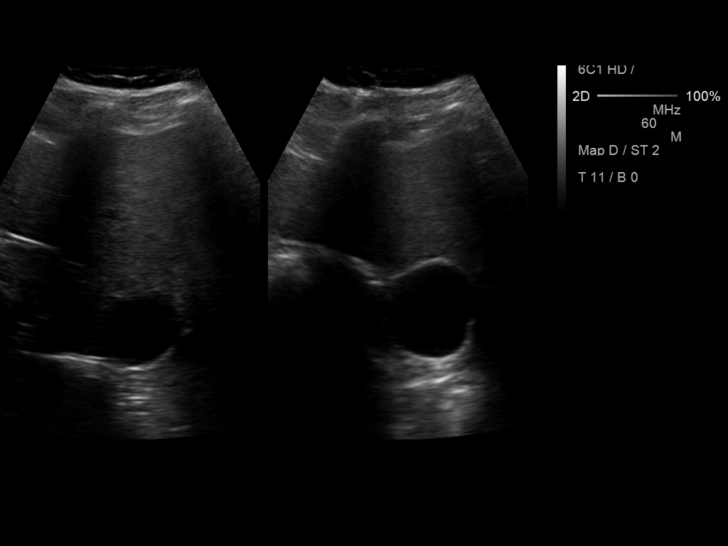
[im 40/44]
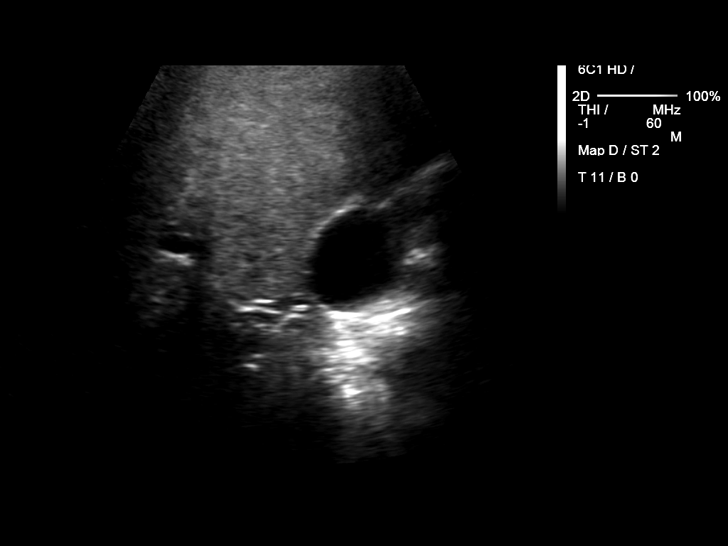
[im 44/44]
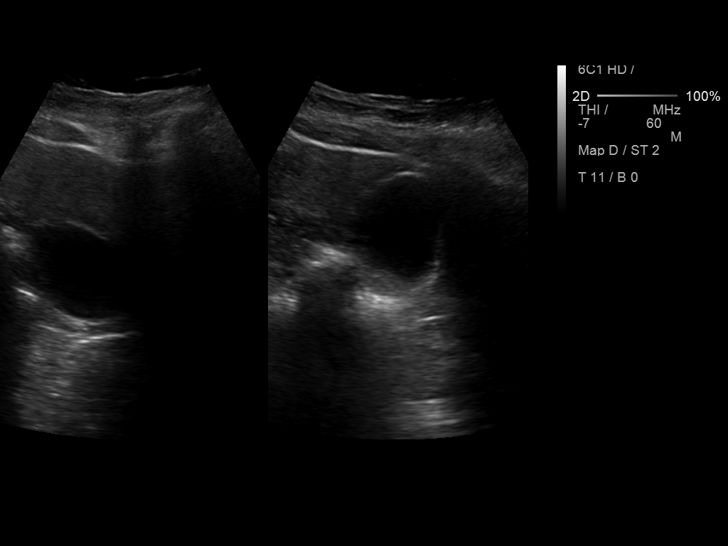

[14 of 25 positions shown; findings below may reference images not displayed]

FINDINGS: Gallbladder:

No gallstones or wall thickening visualized. No sonographic Murphy
sign noted.

Common bile duct:

Diameter: 5 mm in diameter within normal limits.

Liver:

There is diffuse increased echogenicity of the liver consistent with
fatty infiltration. Liver measures 19.5 cm in length. There is a
cyst in right hepatic lobe measures 1.7 x 1.7 cm.
IMPRESSION: 1. No gallstones are noted within gallbladder. Normal CBD. Fatty
infiltration of the liver. Cyst in right hepatic lobe measures 1.7 x
1.7 cm.

## 2015-05-24 ENCOUNTER — Ambulatory Visit (INDEPENDENT_AMBULATORY_CARE_PROVIDER_SITE_OTHER): Payer: Commercial Managed Care - PPO | Admitting: Family Medicine

## 2015-05-24 ENCOUNTER — Encounter: Payer: Self-pay | Admitting: Family Medicine

## 2015-05-24 VITALS — BP 145/95 | HR 67 | Wt 232.0 lb

## 2015-05-24 DIAGNOSIS — Z23 Encounter for immunization: Secondary | ICD-10-CM

## 2015-05-24 DIAGNOSIS — Z1159 Encounter for screening for other viral diseases: Secondary | ICD-10-CM

## 2015-05-24 DIAGNOSIS — Z0000 Encounter for general adult medical examination without abnormal findings: Secondary | ICD-10-CM | POA: Diagnosis not present

## 2015-05-24 DIAGNOSIS — Z9884 Bariatric surgery status: Secondary | ICD-10-CM | POA: Insufficient documentation

## 2015-05-24 DIAGNOSIS — Z114 Encounter for screening for human immunodeficiency virus [HIV]: Secondary | ICD-10-CM | POA: Diagnosis not present

## 2015-05-24 DIAGNOSIS — R7301 Impaired fasting glucose: Secondary | ICD-10-CM

## 2015-05-24 DIAGNOSIS — Z125 Encounter for screening for malignant neoplasm of prostate: Secondary | ICD-10-CM

## 2015-05-24 DIAGNOSIS — I10 Essential (primary) hypertension: Secondary | ICD-10-CM

## 2015-05-24 MED ORDER — LISINOPRIL-HYDROCHLOROTHIAZIDE 20-12.5 MG PO TABS: 1.0000 | ORAL_TABLET | Freq: Every day | ORAL | Status: DC

## 2015-05-24 NOTE — Progress Notes (Signed)
Subjective:    Patient ID: Terry Rojas, male    DOB: 05/11/1960, 55 y.o.   MRN: [id number]  HPI Here for CPE today.  Tetanus vaccine and colonoscopy are up-to-date.   He has been off of his blood pressure medication for a year. He says he stopped it because they were well controlled. He was previous on lisinopril HCT. He did well on this but lost a significant amount of weight after having gastric sleeve surgery in December 2015. He has been running 6 days per week.  Review of Systems Principal review of systems is negative except for history of present illness.  BP 145/95 mmHg  Pulse 67  Wt 232 lb (105.235 kg)  SpO2 100%    No Known Allergies  Past Medical History  Diagnosis Date  . Obese     Past Surgical History  Procedure Laterality Date  . Left knee reconstruction  2010    Dr. Rennis ChrisSupple  . Shoulder surgery  03/14/2011    left, meniscal tear repair.     Social History   Social History  . Marital Status: Single    Spouse Name: N/A  . Number of Children: N/A  . Years of Education: N/A   Occupational History  . Not on file.   Social History Main Topics  . Smoking status: Never Smoker   . Smokeless tobacco: Not on file  . Alcohol Use: No  . Drug Use: No  . Sexual Activity: Not on file   Other Topics Concern  . Not on file   Social History Narrative    Family History  Problem Relation Age of Onset  . Coronary artery disease Father [id number]    6 vessel bypass  . Heart disease Father [id number]  . Hypertension Mother   . Anemia Mother   . Diabetes Maternal Grandmother     Outpatient Encounter Prescriptions as of 05/24/2015  Medication Sig  . [DISCONTINUED] lisinopril-hydrochlorothiazide (PRINZIDE,ZESTORETIC) 20-12.5 MG per tablet ONE BY MOUTH TWICE A DAY   No facility-administered encounter medications on file as of 05/24/2015.          Objective:   Physical Exam  Constitutional: He is oriented to person, place, and time. He appears well-developed and  well-nourished.  HENT:  Head: Normocephalic and atraumatic.  Right Ear: External ear normal.  Left Ear: External ear normal.  Nose: Nose normal.  Mouth/Throat: Oropharynx is clear and moist.  Eyes: Conjunctivae and EOM are normal. Pupils are equal, round, and reactive to light.  Neck: Normal range of motion. Neck supple. No thyromegaly present.  Cardiovascular: Normal rate, regular rhythm, normal heart sounds and intact distal pulses.   Pulmonary/Chest: Effort normal and breath sounds normal.  Abdominal: Soft. Bowel sounds are normal. He exhibits no distension and no mass. There is no tenderness. There is no rebound and no guarding.  Musculoskeletal: Normal range of motion.  Lymphadenopathy:    He has no cervical adenopathy.  Neurological: He is alert and oriented to person, place, and time. He has normal reflexes.  Skin: Skin is warm and dry.  Psychiatric: He has a normal mood and affect. His behavior is normal. Judgment and thought content normal.        Assessment & Plan:  CPE-  Keep up a regular exercise program and make sure you are eating a healthy diet Try to eat 4 servings of dairy a day, or if you are lactose intolerant take a calcium with vitamin D daily.  Your vaccines are up to  date.   HTN - encouraged to restart BP med. Will dose once a day instead of twice a day. Call if any problems with dry mouth or dehydration especially since he is now exercising and working out regularly. Reminded him about potential side effects. Follow-up in 6 weeks to recheck pressure and check a BMP at that point in time.   Status post bariatric surgery-we'll check A1c, thyroid and electrolytes.  Tdap given today.

## 2015-05-25 LAB — COMPLETE METABOLIC PANEL WITH GFR
ALT: 31 U/L (ref 9–46)
AST: 24 U/L (ref 10–35)
Albumin: 4.4 g/dL (ref 3.6–5.1)
Alkaline Phosphatase: 60 U/L (ref 40–115)
BILIRUBIN TOTAL: 1.1 mg/dL (ref 0.2–1.2)
BUN: 20 mg/dL (ref 7–25)
CO2: 27 mmol/L (ref 20–31)
Calcium: 9.3 mg/dL (ref 8.6–10.3)
Chloride: 103 mmol/L (ref 98–110)
Creat: 0.92 mg/dL (ref 0.70–1.33)
GFR, Est African American: >89 mL/min (ref >=60)
GLUCOSE: 97 mg/dL (ref 65–99)
Potassium: 4.1 mmol/L (ref 3.5–5.3)
SODIUM: 141 mmol/L (ref 135–146)
TOTAL PROTEIN: 6.6 g/dL (ref 6.1–8.1)

## 2015-05-25 LAB — LIPID PANEL
Cholesterol: 182 mg/dL (ref 125–200)
HDL: 45 mg/dL (ref >=40)
LDL CALC: 117 mg/dL (ref <130)
Total CHOL/HDL Ratio: 4 Ratio (ref <=5.0)
Triglycerides: 100 mg/dL (ref <150)
VLDL: 20 mg/dL (ref <30)

## 2015-05-25 LAB — CBC
HEMATOCRIT: 42.9 % (ref 38.5–50.0)
Hemoglobin: 15 g/dL (ref 13.2–17.1)
MCH: 31.6 pg (ref 27.0–33.0)
MCHC: 35 g/dL (ref 32.0–36.0)
MCV: 90.3 fL (ref 80.0–100.0)
MPV: 10.1 fL (ref 7.5–12.5)
Platelets: 205 10*3/uL (ref 140–400)
RBC: 4.75 MIL/uL (ref 4.20–5.80)
RDW: 13.5 % (ref 11.0–15.0)
WBC: 5.8 10*3/uL (ref 3.8–10.8)

## 2015-05-25 LAB — TSH: TSH: 1.38 m[IU]/L (ref 0.40–4.50)

## 2015-05-25 LAB — HEMOGLOBIN A1C
Hgb A1c MFr Bld: 5.3 % (ref <5.7)
Mean Plasma Glucose: 105 mg/dL

## 2015-05-26 LAB — PSA: PSA: 0.35 ng/mL (ref <=4.00)

## 2015-05-26 LAB — HIV ANTIBODY (ROUTINE TESTING W REFLEX): HIV 1&2 Ab, 4th Generation: NONREACTIVE

## 2015-05-26 LAB — HEPATITIS C ANTIBODY: HCV Ab: NEGATIVE

## 2015-07-05 ENCOUNTER — Ambulatory Visit: Payer: PRIVATE HEALTH INSURANCE | Admitting: Family Medicine

## 2015-07-15 ENCOUNTER — Encounter: Payer: Self-pay | Admitting: Family Medicine

## 2015-07-15 ENCOUNTER — Ambulatory Visit (INDEPENDENT_AMBULATORY_CARE_PROVIDER_SITE_OTHER): Payer: Commercial Managed Care - PPO | Admitting: Family Medicine

## 2015-07-15 VITALS — BP 133/84 | HR 62 | Wt 228.0 lb

## 2015-07-15 DIAGNOSIS — I10 Essential (primary) hypertension: Secondary | ICD-10-CM | POA: Diagnosis not present

## 2015-07-15 DIAGNOSIS — Z6831 Body mass index (BMI) 31.0-31.9, adult: Secondary | ICD-10-CM | POA: Diagnosis not present

## 2015-07-15 DIAGNOSIS — Z9884 Bariatric surgery status: Secondary | ICD-10-CM | POA: Diagnosis not present

## 2015-07-15 LAB — BASIC METABOLIC PANEL
BUN: 26 mg/dL — AB (ref 7–25)
CHLORIDE: 103 mmol/L (ref 98–110)
CO2: 27 mmol/L (ref 20–31)
CREATININE: 0.95 mg/dL (ref 0.70–1.33)
Calcium: 9 mg/dL (ref 8.6–10.3)
Glucose, Bld: 88 mg/dL (ref 65–99)
Potassium: 3.7 mmol/L (ref 3.5–5.3)
Sodium: 142 mmol/L (ref 135–146)

## 2015-07-15 NOTE — Progress Notes (Signed)
Subjective:    CC: HTN  HPI: Hypertension- Pt denies chest pain, SOB, dizziness, or heart palpitations.  Taking meds as directed w/o problems.  Denies medication side effects.    BMI 31 - he has lost about 4 lbs since last here. Walking and exercising regularly.  He has really made sig lifestyle changes. Takes his MVI and calcium daily.        Objective:    General: Well Developed, well nourished, and in no acute distress.  Neuro: Alert and oriented x3, extra-ocular muscles intact, sensation grossly intact.  HEENT: Normocephalic, atraumatic  Skin: Warm and dry, no rashes. Cardiac: Regular rate and rhythm, no murmurs rubs or gallops, no lower extremity edema.  Respiratory: Clear to auscultation bilaterally. Not using accessory muscles, speaking in full sentences.   Impression and Recommendations:   HTN- well controlled. F/U in 6 months. Due for BMP.    BMI 31/s/p bariatric surgery - he is doing well. Has lost 4 lbs. He is on his daily MVI and calcium

## 2015-07-16 NOTE — Progress Notes (Signed)
Quick Note:  All labs are normal. ______ 

## 2015-11-15 ENCOUNTER — Other Ambulatory Visit: Payer: Self-pay | Admitting: Family Medicine

## 2016-01-10 ENCOUNTER — Ambulatory Visit: Payer: PRIVATE HEALTH INSURANCE | Admitting: Family Medicine

## 2016-08-11 ENCOUNTER — Other Ambulatory Visit: Payer: Self-pay | Admitting: Family Medicine

## 2016-09-11 ENCOUNTER — Other Ambulatory Visit: Payer: Self-pay | Admitting: Family Medicine

## 2016-10-16 ENCOUNTER — Other Ambulatory Visit: Payer: Self-pay | Admitting: Family Medicine

## 2017-12-24 ENCOUNTER — Encounter: Payer: Self-pay | Admitting: Family Medicine

## 2017-12-24 ENCOUNTER — Ambulatory Visit (INDEPENDENT_AMBULATORY_CARE_PROVIDER_SITE_OTHER): Payer: BLUE CROSS/BLUE SHIELD | Admitting: Family Medicine

## 2017-12-24 VITALS — BP 155/104 | HR 89 | Ht 71.0 in | Wt 245.0 lb

## 2017-12-24 DIAGNOSIS — R7301 Impaired fasting glucose: Secondary | ICD-10-CM

## 2017-12-24 DIAGNOSIS — I10 Essential (primary) hypertension: Secondary | ICD-10-CM | POA: Diagnosis not present

## 2017-12-24 DIAGNOSIS — Z125 Encounter for screening for malignant neoplasm of prostate: Secondary | ICD-10-CM | POA: Diagnosis not present

## 2017-12-24 MED ORDER — LISINOPRIL-HYDROCHLOROTHIAZIDE 20-12.5 MG PO TABS: 1.0000 | ORAL_TABLET | Freq: Every day | ORAL | 1 refills | Status: DC

## 2017-12-24 NOTE — Progress Notes (Signed)
Subjective:    CC: HTN, glucose.   HPI:  Hypertension- Pt denies chest pain, SOB, dizziness, or heart palpitations.  Taking meds as directed w/o problems.  Denies medication side effects.  He did check his blood pressure at home yesterday and it was 130/90.  He did report taking his medications today.  Admits he has not been working out and exercising like he was previously but plans on getting back on track with that.  He travels about 52 weeks out of the year.  Impaired fasting glucose-no increased thirst or urination. No symptoms consistent with hypoglycemia.  He also wanted to let me know that his father was diagnosed with prostate cancer this year.  Past medical history, Surgical history, Family history not pertinant except as noted below, Social history, Allergies, and medications have been entered into the medical record, reviewed, and corrections made.   Review of Systems: No fevers, chills, night sweats, weight loss, chest pain, or shortness of breath.   Objective:    General: Well Developed, well nourished, and in no acute distress.  Neuro: Alert and oriented x3, extra-ocular muscles intact, sensation grossly intact.  HEENT: Normocephalic, atraumatic  Skin: Warm and dry, no rashes. Cardiac: Regular rate and rhythm, no murmurs rubs or gallops, no lower extremity edema.  Respiratory: Clear to auscultation bilaterally. Not using accessory muscles, speaking in full sentences.   Impression and Recommendations:     HTN -Uncontrolled uncontrolled.  We discussed options.  We can have him track at home for the next couple of weeks and let me know what his numbers are doing.  If his diastolic is still staying elevated then I would really like to consider adding amlodipine to his regimen we could even consider switching him to [employer].  He will continue to monitor it over the next 2 weeks.  In fact he actually has an appoint with me at the end of that and we can adjust the dose if  needed.  He plans on getting back on track with his exercise.  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  So that makes it a little bit more difficult for food choices as well.  IFG - Well controlled. Continue current regimen. Follow up in  6 months.    Due for screening PSA. Father was diagnosed with prostate Cancer this year.

## 2017-12-24 NOTE — Patient Instructions (Signed)
Check blood pressure at home over the next 2 weeks.  Send me a note with your numbers are call with those and we can see if we may need to add a second medication called amlodipine to help lower your blood pressure.  If we start any medication that I want to have you come back about 2 to 3 weeks after you start it for nurse visit to make sure that it is working well.

## 2018-01-09 DIAGNOSIS — Z125 Encounter for screening for malignant neoplasm of prostate: Secondary | ICD-10-CM | POA: Diagnosis not present

## 2018-01-09 DIAGNOSIS — R7301 Impaired fasting glucose: Secondary | ICD-10-CM | POA: Diagnosis not present

## 2018-01-09 DIAGNOSIS — I10 Essential (primary) hypertension: Secondary | ICD-10-CM | POA: Diagnosis not present

## 2018-01-10 LAB — PSA: PSA: 0.3 ng/mL (ref <=4.0)

## 2018-01-10 LAB — HEMOGLOBIN A1C
HEMOGLOBIN A1C: 6.3 %{Hb} — AB (ref <5.7)
Mean Plasma Glucose: 134 (calc)
eAG (mmol/L): 7.4 (calc)

## 2018-01-10 LAB — LIPID PANEL
Cholesterol: 195 mg/dL (ref <200)
HDL: 33 mg/dL — ABNORMAL LOW (ref >40)
LDL Cholesterol (Calc): 132 mg/dL (calc) — ABNORMAL HIGH
Non-HDL Cholesterol (Calc): 162 mg/dL (calc) — ABNORMAL HIGH (ref <130)
Total CHOL/HDL Ratio: 5.9 (calc) — ABNORMAL HIGH (ref <5.0)
Triglycerides: 167 mg/dL — ABNORMAL HIGH (ref <150)

## 2018-01-10 LAB — COMPLETE METABOLIC PANEL WITH GFR
AG RATIO: 1.8 (calc) (ref 1.0–2.5)
ALBUMIN MSPROF: 4.4 g/dL (ref 3.6–5.1)
ALT: 41 U/L (ref 9–46)
AST: 27 U/L (ref 10–35)
Alkaline phosphatase (APISO): 47 U/L (ref 40–115)
BILIRUBIN TOTAL: 1 mg/dL (ref 0.2–1.2)
BUN / CREAT RATIO: 26 (calc) — AB (ref 6–22)
BUN: 32 mg/dL — ABNORMAL HIGH (ref 7–25)
CALCIUM: 9.4 mg/dL (ref 8.6–10.3)
CO2: 27 mmol/L (ref 20–32)
Chloride: 101 mmol/L (ref 98–110)
Creat: 1.21 mg/dL (ref 0.70–1.33)
GFR, EST AFRICAN AMERICAN: 77 mL/min/{1.73_m2} (ref >=60)
GFR, EST NON AFRICAN AMERICAN: 67 mL/min/{1.73_m2} (ref >=60)
GLOBULIN: 2.5 g/dL (ref 1.9–3.7)
Glucose, Bld: 110 mg/dL — ABNORMAL HIGH (ref 65–99)
POTASSIUM: 4.1 mmol/L (ref 3.5–5.3)
SODIUM: 139 mmol/L (ref 135–146)
TOTAL PROTEIN: 6.9 g/dL (ref 6.1–8.1)

## 2018-01-10 LAB — TSH: TSH: 1.57 mIU/L (ref 0.40–4.50)

## 2018-01-14 ENCOUNTER — Ambulatory Visit (INDEPENDENT_AMBULATORY_CARE_PROVIDER_SITE_OTHER): Payer: BLUE CROSS/BLUE SHIELD | Admitting: Family Medicine

## 2018-01-14 ENCOUNTER — Encounter: Payer: Self-pay | Admitting: Family Medicine

## 2018-01-14 VITALS — BP 138/88 | HR 88 | Ht 71.0 in | Wt 243.0 lb

## 2018-01-14 DIAGNOSIS — I10 Essential (primary) hypertension: Secondary | ICD-10-CM

## 2018-01-14 DIAGNOSIS — Z0000 Encounter for general adult medical examination without abnormal findings: Secondary | ICD-10-CM | POA: Diagnosis not present

## 2018-01-14 DIAGNOSIS — R7301 Impaired fasting glucose: Secondary | ICD-10-CM

## 2018-01-14 MED ORDER — AMLODIPINE BESYLATE 2.5 MG PO TABS: 2.5000 mg | ORAL_TABLET | Freq: Every day | ORAL | 1 refills | Status: DC

## 2018-01-14 NOTE — Assessment & Plan Note (Signed)
Add low dose Amlodipine for helping your diastolic

## 2018-01-14 NOTE — Assessment & Plan Note (Signed)
Does have impaired fasting glucose-explained A1c levels.  Discussed the importance of cutting back on sweets and carbs.  Plan to recheck in 6 months.  He is getting back into his regular exercise and weight loss and this should help as well.

## 2018-01-14 NOTE — Progress Notes (Signed)
CPE - Established Patient Office Visit  Subjective:  Patient ID: Terry Rojas, male    DOB: 05/26/1960  Age: 58 y.o. MRN: [id number]  CC:  Chief Complaint  Patient presents with  . Annual Exam    HPI Terry Daneodd Rehmann presents for wellness exam.  He is also been tracking his blood pressure and reports that his diastolic numbers have been up a little bit first thing in the morning and it does come down a little bit after exercise.  More recently after he got his labs back from last week he has been trying to exercise a little bit more.  He has been mostly doing some rowing.  He knows he needs to really get back on track and try to lose a little bit of weight as well.  A couple years ago he was running half marathons and using weight watchers.  He does snore but tested negative for sleep apnea.  Past Medical History:  Diagnosis Date  . Obese     Past Surgical History:  Procedure Laterality Date  . left knee reconstruction  2010   Dr. Rennis ChrisSupple  . SHOULDER SURGERY  03/14/2011   left, meniscal tear repair.     Family History  Problem Relation Age of Onset  . Coronary artery disease Father 7157       6 vessel bypass  . Heart disease Father 7357  . Prostate cancer Father 8877  . Hypertension Mother   . Anemia Mother   . Diabetes Maternal Grandmother     Social History   Socioeconomic History  . Marital status: Single    Spouse name: Not on file  . Number of children: Not on file  . Years of education: Not on file  . Highest education level: Not on file  Occupational History  . Not on file  Social Needs  . Financial resource strain: Not on file  . Food insecurity:    Worry: Not on file    Inability: Not on file  . Transportation needs:    Medical: Not on file    Non-medical: Not on file  Tobacco Use  . Smoking status: Never Smoker  . Smokeless tobacco: Never Used  Substance and Sexual Activity  . Alcohol use: No  . Drug use: No  . Sexual activity: Not on file  Lifestyle  .  Physical activity:    Days per week: Not on file    Minutes per session: Not on file  . Stress: Not on file  Relationships  . Social connections:    Talks on phone: Not on file    Gets together: Not on file    Attends religious service: Not on file    Active member of club or organization: Not on file    Attends meetings of clubs or organizations: Not on file    Relationship status: Not on file  . Intimate partner violence:    Fear of current or ex partner: Not on file    Emotionally abused: Not on file    Physically abused: Not on file    Forced sexual activity: Not on file  Other Topics Concern  . Not on file  Social History Narrative  . Not on file    Outpatient Medications Prior to Visit  Medication Sig Dispense Refill  . lisinopril-hydrochlorothiazide (PRINZIDE,ZESTORETIC) 20-12.5 MG tablet Take 1 tablet by mouth daily. Due for follow up visit 30 tablet 1  . Multiple Vitamins-Minerals (MULTIVITAMIN ADULT PO) Take 2 tablets by mouth  daily.     No facility-administered medications prior to visit.     No Known Allergies  ROS Review of Systems    Objective:    Physical Exam  Constitutional: He is oriented to person, place, and time. He appears well-developed and well-nourished.  HENT:  Head: Normocephalic and atraumatic.  Right Ear: External ear normal.  Left Ear: External ear normal.  Nose: Nose normal.  Mouth/Throat: Oropharynx is clear and moist.  Eyes: Pupils are equal, round, and reactive to light. Conjunctivae and EOM are normal.  Neck: Normal range of motion. Neck supple. No thyromegaly present.  Cardiovascular: Normal rate, regular rhythm, normal heart sounds and intact distal pulses.  Pulmonary/Chest: Effort normal and breath sounds normal.  Abdominal: Soft. Bowel sounds are normal. He exhibits no distension and no mass. There is no abdominal tenderness. There is no rebound and no guarding.  Musculoskeletal: Normal range of motion.  Lymphadenopathy:     He has no cervical adenopathy.  Neurological: He is alert and oriented to person, place, and time. He has normal reflexes.  Skin: Skin is warm and dry.  Psychiatric: He has a normal mood and affect. His behavior is normal. Judgment and thought content normal.    BP 138/88   Pulse 88   Ht 5\' 11"  (1.803 m)   Wt 243 lb (110.2 kg)   SpO2 98%   BMI 33.89 kg/m  Wt Readings from Last 3 Encounters:  01/14/18 243 lb (110.2 kg)  12/24/17 245 lb (111.1 kg)  07/15/15 228 lb (103.4 kg)     There are no preventive care reminders to display for this patient.  There are no preventive care reminders to display for this patient.  Lab Results  Component Value Date   TSH 1.57 01/09/2018   Lab Results  Component Value Date   WBC 5.8 05/24/2015   HGB 15.0 05/24/2015   HCT 42.9 05/24/2015   MCV 90.3 05/24/2015   PLT 205 05/24/2015   Lab Results  Component Value Date   NA 139 01/09/2018   K 4.1 01/09/2018   CO2 27 01/09/2018   GLUCOSE 110 (H) 01/09/2018   BUN 32 (H) 01/09/2018   CREATININE 1.21 01/09/2018   BILITOT 1.0 01/09/2018   ALKPHOS 60 05/24/2015   AST 27 01/09/2018   ALT 41 01/09/2018   PROT 6.9 01/09/2018   ALBUMIN 4.4 05/24/2015   CALCIUM 9.4 01/09/2018   Lab Results  Component Value Date   CHOL 195 01/09/2018   Lab Results  Component Value Date   HDL 33 (L) 01/09/2018   Lab Results  Component Value Date   LDLCALC 132 (H) 01/09/2018   Lab Results  Component Value Date   TRIG 167 (H) 01/09/2018   Lab Results  Component Value Date   CHOLHDL 5.9 (H) 01/09/2018   Lab Results  Component Value Date   HGBA1C 6.3 (H) 01/09/2018      Assessment & Plan:   Problem List Items Addressed This Visit      Cardiovascular and Mediastinum   Essential hypertension, benign    Add low dose Amlodipine for helping your diastolic      Relevant Medications   amLODipine (NORVASC) 2.5 MG tablet     Endocrine   IFG (impaired fasting glucose)    Does have impaired  fasting glucose-explained A1c levels.  Discussed the importance of cutting back on sweets and carbs.  Plan to recheck in 6 months.  He is getting back into his regular exercise and weight  loss and this should help as well.       Other Visit Diagnoses    Wellness examination    -  Primary     Keep up a regular exercise program and make sure you are eating a healthy diet Try to eat 4 servings of dairy a day, or if you are lactose intolerant take a calcium with vitamin D daily.  Your vaccines are up to date.  Reviewed labs with him.   Meds ordered this encounter  Medications  . amLODipine (NORVASC) 2.5 MG tablet    Sig: Take 1 tablet (2.5 mg total) by mouth daily.    Dispense:  90 tablet    Refill:  1    Follow-up: Return in about 6 months (around 07/15/2018) for Repeat A1C to evaluate blood sugars.  Nani Gasser.    Catherine Metheney, MD

## 2018-01-14 NOTE — Patient Instructions (Addendum)

## 2018-01-15 ENCOUNTER — Other Ambulatory Visit: Payer: Self-pay | Admitting: Family Medicine

## 2018-03-08 ENCOUNTER — Encounter: Payer: Self-pay | Admitting: Family Medicine

## 2018-03-08 ENCOUNTER — Ambulatory Visit (INDEPENDENT_AMBULATORY_CARE_PROVIDER_SITE_OTHER): Payer: BLUE CROSS/BLUE SHIELD | Admitting: Family Medicine

## 2018-03-08 VITALS — BP 158/114 | HR 91 | Temp 98.4°F | Ht 71.0 in | Wt 253.0 lb

## 2018-03-08 DIAGNOSIS — J019 Acute sinusitis, unspecified: Secondary | ICD-10-CM | POA: Diagnosis not present

## 2018-03-08 MED ORDER — AZITHROMYCIN 250 MG PO TABS: ORAL_TABLET | ORAL | 0 refills | Status: AC

## 2018-03-08 NOTE — Progress Notes (Signed)
Acute Office Visit  Subjective:    Patient ID: Terry Rojas, male    DOB: 04-16-1960, 58 y.o.   MRN: [id number]  Chief Complaint  Patient presents with  . Cough    4 days    HPI Patient is in today for URI symptoms for 5 days.  He has had productive cough, sinus congestion.  No nausea or fever.  No body aches.  No ear pain.  They have been irritated throat.  No GI symptoms.  He has been taking DayQuil, NyQuil and some decongestant products.  Is actually feeling a little bit better today.  P[place] [place] a lot for work and in fact just got back from a 12-hour plane ride yesterday.  Past Medical History:  Diagnosis Date  . Obese     Past Surgical History:  Procedure Laterality Date  . left knee reconstruction  2010   Dr. Rennis Chris  . SHOULDER SURGERY  03/14/2011   left, meniscal tear repair.     Family History  Problem Relation Age of Onset  . Coronary artery disease Father 44       6 vessel bypass  . Heart disease Father 12  . Prostate cancer Father 20  . Hypertension Mother   . Anemia Mother   . Diabetes Maternal Grandmother     Social History   Socioeconomic History  . Marital status: Single    Spouse name: Not on file  . Number of children: Not on file  . Years of education: Not on file  . Highest education level: Not on file  Occupational History  . Not on file  Social Needs  . Financial resource strain: Not on file  . Food insecurity:    Worry: Not on file    Inability: Not on file  . Transportation needs:    Medical: Not on file    Non-medical: Not on file  Tobacco Use  . Smoking status: Never Smoker  . Smokeless tobacco: Never Used  Substance and Sexual Activity  . Alcohol use: No  . Drug use: No  . Sexual activity: Not on file  Lifestyle  . Physical activity:    Days per week: Not on file    Minutes per session: Not on file  . Stress: Not on file  Relationships  . Social connections:    Talks on phone: Not on file    Gets together: Not on file   Attends religious service: Not on file    Active member of club or organization: Not on file    Attends meetings of clubs or organizations: Not on file    Relationship status: Not on file  . Intimate partner violence:    Fear of current or ex partner: Not on file    Emotionally abused: Not on file    Physically abused: Not on file    Forced sexual activity: Not on file  Other Topics Concern  . Not on file  Social History Narrative  . Not on file    Outpatient Medications Prior to Visit  Medication Sig Dispense Refill  . amLODipine (NORVASC) 2.5 MG tablet Take 1 tablet (2.5 mg total) by mouth daily. 90 tablet 1  . lisinopril-hydrochlorothiazide (PRINZIDE,ZESTORETIC) 20-12.5 MG tablet Take 1 tablet by mouth daily. 90 tablet 1  . Multiple Vitamins-Minerals (MULTIVITAMIN ADULT PO) Take 2 tablets by mouth daily.     No facility-administered medications prior to visit.     No Known Allergies  ROS     Objective:  Physical Exam  Constitutional: He is oriented to person, place, and time. He appears well-developed and well-nourished.  HENT:  Head: Normocephalic and atraumatic.  Right Ear: External ear normal.  Left Ear: External ear normal.  Nose: Nose normal.  Mouth/Throat: Oropharynx is clear and moist.  TMs and canals are clear.   Eyes: Pupils are equal, round, and reactive to light. Conjunctivae and EOM are normal.  Neck: Neck supple. No thyromegaly present.  Cardiovascular: Normal rate and normal heart sounds.  Pulmonary/Chest: Effort normal and breath sounds normal.  Lymphadenopathy:    He has no cervical adenopathy.  Neurological: He is alert and oriented to person, place, and time.  Skin: Skin is warm and dry.  Psychiatric: He has a normal mood and affect.    BP (!) 158/114   Pulse 91   Temp 98.4 F (36.9 C)   Ht  (1.803 m)   Wt 253 lb (114.8 kg)   SpO2 98%   BMI 35.29 kg/m  Wt Readings from Last 3 Encounters:  03/08/18 253 lb (114.8 kg)  01/14/18  243 lb (110.2 kg)  12/24/17 245 lb (111.1 kg)    There are no preventive care reminders to display for this patient.  There are no preventive care reminders to display for this patient.   Lab Results  Component Value Date   TSH 1.57 01/09/2018   Lab Results  Component Value Date   WBC 5.8 05/24/2015   HGB 15.0 05/24/2015   HCT 42.9 05/24/2015   MCV 90.3 05/24/2015   PLT 205 05/24/2015   Lab Results  Component Value Date   NA 139 01/09/2018   K 4.1 01/09/2018   CO2 27 01/09/2018   GLUCOSE 110 (H) 01/09/2018   BUN 32 (H) 01/09/2018   CREATININE 1.21 01/09/2018   BILITOT 1.0 01/09/2018   ALKPHOS 60 05/24/2015   AST 27 01/09/2018   ALT 41 01/09/2018   PROT 6.9 01/09/2018   ALBUMIN 4.4 05/24/2015   CALCIUM 9.4 01/09/2018   Lab Results  Component Value Date   CHOL 195 01/09/2018   Lab Results  Component Value Date   HDL 33 (L) 01/09/2018   Lab Results  Component Value Date   LDLCALC 132 (H) 01/09/2018   Lab Results  Component Value Date   TRIG 167 (H) 01/09/2018   Lab Results  Component Value Date   CHOLHDL 5.9 (H) 01/09/2018   Lab Results  Component Value Date   HGBA1C 6.3 (H) 01/09/2018       Assessment & Plan:   Problem List Items Addressed This Visit    None    Visit Diagnoses    Acute non-recurrent sinusitis, unspecified location    -  Primary   Relevant Medications   azithromycin (ZITHROMAX) 250 MG tablet     Acute sinusitis-explained likely viral since he is only had symptoms for about 5 days and is feeling a little bit better today but he is getting ready to head out of town again for another week so I did go ahead and give him azithromycin encouraged him to give it through the weekend to see if he continues to feel better and if not okay to fill the prescription or if he suddenly feels worse he can fill it.  If at any point he feels like he is developing respiratory symptoms and encouraged him to give [place] a call.  Meds ordered this  encounter  Medications  . azithromycin (ZITHROMAX) 250 MG tablet    Sig:  2 Ttabs PO on Day 1, then one a day x 4 days.    Dispense:  6 tablet    Refill:  0     Nani Gasser, MD

## 2018-07-10 ENCOUNTER — Other Ambulatory Visit: Payer: Self-pay | Admitting: Family Medicine

## 2018-07-15 ENCOUNTER — Ambulatory Visit: Payer: BLUE CROSS/BLUE SHIELD | Admitting: Family Medicine

## 2018-07-22 ENCOUNTER — Other Ambulatory Visit: Payer: Self-pay | Admitting: Family Medicine

## 2018-10-12 ENCOUNTER — Other Ambulatory Visit: Payer: Self-pay | Admitting: Family Medicine

## 2018-11-07 ENCOUNTER — Other Ambulatory Visit: Payer: Self-pay | Admitting: Family Medicine

## 2018-11-15 ENCOUNTER — Other Ambulatory Visit: Payer: Self-pay | Admitting: Family Medicine

## 2019-01-12 ENCOUNTER — Other Ambulatory Visit: Payer: Self-pay | Admitting: Family Medicine

## 2019-01-13 NOTE — Telephone Encounter (Signed)
Needs appointment

## 2019-04-18 ENCOUNTER — Other Ambulatory Visit: Payer: Self-pay | Admitting: Family Medicine

## 2019-05-15 ENCOUNTER — Other Ambulatory Visit: Payer: Self-pay | Admitting: Family Medicine

## 2019-05-25 ENCOUNTER — Other Ambulatory Visit: Payer: Self-pay | Admitting: Family Medicine

## 2019-11-20 ENCOUNTER — Other Ambulatory Visit: Payer: Self-pay | Admitting: Family Medicine

## 2019-11-21 NOTE — Telephone Encounter (Signed)
LVM advising pt to rtn call to schedule a f/u for labs and medication refills

## 2019-12-16 ENCOUNTER — Other Ambulatory Visit: Payer: Self-pay | Admitting: Family Medicine

## 2019-12-16 MED ORDER — AMLODIPINE BESYLATE 2.5 MG PO TABS: 2.5000 mg | ORAL_TABLET | Freq: Every day | ORAL | 1 refills | Status: DC

## 2020-04-13 ENCOUNTER — Other Ambulatory Visit: Payer: Self-pay

## 2020-04-13 ENCOUNTER — Ambulatory Visit (INDEPENDENT_AMBULATORY_CARE_PROVIDER_SITE_OTHER): Payer: PRIVATE HEALTH INSURANCE | Admitting: Family Medicine

## 2020-04-13 ENCOUNTER — Ambulatory Visit: Payer: Self-pay

## 2020-04-13 VITALS — BP 120/74 | Ht 71.0 in | Wt 234.8 lb

## 2020-04-13 DIAGNOSIS — M7022 Olecranon bursitis, left elbow: Secondary | ICD-10-CM | POA: Diagnosis not present

## 2020-04-13 MED ORDER — CEPHALEXIN 500 MG PO CAPS: 500.0000 mg | ORAL_CAPSULE | Freq: Two times a day (BID) | ORAL | 0 refills | Status: DC

## 2020-04-13 NOTE — Assessment & Plan Note (Signed)
Had a fall in January and recently had a sinus develop.  Bloody in nature. -Counseled on home exercise therapy and supportive care. -Aspiration. -Keflex.

## 2020-04-13 NOTE — Patient Instructions (Signed)
Nice to meet you Please try compression  Please complete the medicine   Please send me a message in MyChart with any questions or updates.  Please see me back in 4 weeks.   --Dr. Jordan Likes

## 2020-04-13 NOTE — Progress Notes (Signed)
Terry Rojas - 60 y.o. male MRN [id number]  Date of birth: August 04, 1960  SUBJECTIVE:  Including CC & ROS.  No chief complaint on file.   Terry Rojas is a 60 y.o. male that is presenting with left elbow pain. Having swelling for a few weeks. Had a fall back in January. No pain. No streaking or redness.    Review of Systems See HPI   HISTORY: Past Medical, Surgical, Social, and Family History Reviewed & Updated per EMR.   Pertinent Historical Findings include:  Past Medical History:  Diagnosis Date  . Obese     Past Surgical History:  Procedure Laterality Date  . left knee reconstruction  2010   Dr. Rennis Chris  . SHOULDER SURGERY  03/14/2011   left, meniscal tear repair.     Family History  Problem Relation Age of Onset  . Coronary artery disease Father 2       6 vessel bypass  . Heart disease Father 21  . Prostate cancer Father 74  . Hypertension Mother   . Anemia Mother   . Diabetes Maternal Grandmother     Social History   Socioeconomic History  . Marital status: Single    Spouse name: Not on file  . Number of children: Not on file  . Years of education: Not on file  . Highest education level: Not on file  Occupational History  . Not on file  Tobacco Use  . Smoking status: Never Smoker  . Smokeless tobacco: Never Used  Substance and Sexual Activity  . Alcohol use: No  . Drug use: No  . Sexual activity: Not on file  Other Topics Concern  . Not on file  Social History Narrative  . Not on file   Social Determinants of Health   Financial Resource Strain: Not on file  Food Insecurity: Not on file  Transportation Needs: Not on file  Physical Activity: Not on file  Stress: Not on file  Social Connections: Not on file  Intimate Partner Violence: Not on file     PHYSICAL EXAM:  VS: BP 120/74   Ht 5\' 11"  (1.803 m)   Wt 234 lb 12.8 oz (106.5 kg)   BMI 32.75 kg/m  Physical Exam Gen: NAD, alert, cooperative with exam, well-appearing MSK:  Left elbow:   Bursitis present  No redness or warmth  Normal range of motion  Neurovascularly intact     Aspiration/Injection Procedure Note Shaft Corigliano April 28, 1960  Procedure: Aspiration Indications: left elbow bursitis   Procedure Details Consent: Risks of procedure as well as the alternatives and risks of each were explained to the (patient/caregiver).  Consent for procedure obtained. Time Out: Verified patient identification, verified procedure, site/side was marked, verified correct patient position, special equipment/implants available, medications/allergies/relevent history reviewed, required imaging and test results available.  Performed.  The area was cleaned with iodine and alcohol swabs.    The left olecranon bursitis injected using 3 cc's of 1% lidocaine with a 25 1 1/2" needle.  An 18-gauge 1-1/2 inch needle was used to achieve aspiration.  Ultrasound was used. Images were obtained in long views showing the injection.    Amount of Fluid Aspirated: 23mL Character of Fluid: bloody Fluid was sent for:n/a  A sterile dressing was applied.  Patient did tolerate procedure well.     ASSESSMENT & PLAN:   Olecranon bursitis of left elbow Had a fall in January and recently had a sinus develop.  Bloody in nature. -Counseled on home exercise therapy and supportive  care. -Aspiration. -Keflex.

## 2020-07-08 ENCOUNTER — Other Ambulatory Visit: Payer: Self-pay | Admitting: Family Medicine

## 2020-07-08 NOTE — Telephone Encounter (Signed)
Please call pt and have him schedule an appt for f/u on bp,medication refills and fasting labs. Thanks! Will refill his medications for 1 month ONLY

## 2020-07-08 NOTE — Telephone Encounter (Signed)
LVM for patient that one month has been sent of his medication and that no more will be refilled until he has an appt to follow up on his BP and have fasting labs.

## 2020-08-12 ENCOUNTER — Other Ambulatory Visit: Payer: Self-pay | Admitting: Family Medicine

## 2020-08-12 NOTE — Telephone Encounter (Signed)
Please call pt he is OVERDUE FOR FOLLOW UP AND FASTING LABS and the medication that he takes has to be monitored and he has to have blood work. 30 day supply given.

## 2020-08-16 NOTE — Telephone Encounter (Signed)
I called and left a VM for patient to call back and schedule a F/u appt with fasting labs with Dr. Linford Arnold

## 2020-08-31 ENCOUNTER — Other Ambulatory Visit: Payer: Self-pay | Admitting: Family Medicine

## 2022-03-20 ENCOUNTER — Encounter: Payer: Self-pay | Admitting: Family Medicine

## 2022-03-20 ENCOUNTER — Ambulatory Visit: Payer: 59 | Admitting: Family Medicine

## 2022-03-20 VITALS — BP 146/80 | HR 68 | Temp 98.0°F | Resp 18 | Ht 71.0 in | Wt 218.0 lb

## 2022-03-20 DIAGNOSIS — Z7689 Persons encountering health services in other specified circumstances: Secondary | ICD-10-CM | POA: Diagnosis not present

## 2022-03-20 DIAGNOSIS — I10 Essential (primary) hypertension: Secondary | ICD-10-CM | POA: Diagnosis not present

## 2022-03-20 DIAGNOSIS — K760 Fatty (change of) liver, not elsewhere classified: Secondary | ICD-10-CM | POA: Insufficient documentation

## 2022-03-20 MED ORDER — LOSARTAN POTASSIUM-HCTZ 50-12.5 MG PO TABS: 1.0000 | ORAL_TABLET | Freq: Every day | ORAL | 1 refills | Status: DC

## 2022-03-20 NOTE — Progress Notes (Signed)
New Patient Office Visit  Subjective    Patient ID: Terry Rojas, male    DOB: Nov 01, 1960  Age: 62 y.o. MRN: [id number]  CC:  Chief Complaint  Patient presents with   Establish Care   Hypertension    HPI Terry Rojas presents to establish care  Hypertension Pt reports on March 6th, at 10 pm he had a nosebleed. Pt reports this is the first time he had a nosebleed. He had a second nosebleed on March 7th. He reports he checked his blood pressure and it was running 170/100. Pt reports during that time, he feels like he's had more caffeine  along with stress related his work. He also had headaches during that time, no dizziness or blurred vision. He reports he wasn't taking his blood pressure medicines in weeks before this event. He says it caused a dry tickling cough. He says since he stopped it during that time, he no longer had a cough. He says since the nosebleeds, he restarted taking the blood pressure medicine. This morning blood pressure check was 150 sbp.  Outpatient Encounter Medications as of 03/20/2022  Medication Sig   ALPHA LIPOIC ACID PO Take 600 mg by mouth 1 day or 1 dose.   amLODipine (NORVASC) 2.5 MG tablet Take 1 tablet (2.5 mg total) by mouth daily. 2 WEEK REFILL GIVEN. APPOINTMENT REQUIRED FOR REFILLS.   lisinopril-hydrochlorothiazide (ZESTORETIC) 20-12.5 MG tablet Take 1 tablet by mouth daily.   [DISCONTINUED] Multiple Vitamins-Minerals (MULTIVITAMIN ADULT PO) Take 2 tablets by mouth daily.   cephALEXin (KEFLEX) 500 MG capsule Take 1 capsule (500 mg total) by mouth 2 (two) times daily.   No facility-administered encounter medications on file as of 03/20/2022.    Past Medical History:  Diagnosis Date   Obese     Past Surgical History:  Procedure Laterality Date   left knee reconstruction  2010   Dr. Onnie Graham   SHOULDER SURGERY  03/14/2011   left, meniscal tear repair.     Family History  Problem Relation Age of Onset   Coronary artery disease Father 46       6  vessel bypass   Heart disease Father 13   Prostate cancer Father 23   Hypertension Mother    Anemia Mother    Diabetes Maternal Grandmother     Social History   Socioeconomic History   Marital status: Single    Spouse name: Not on file   Number of children: Not on file   Years of education: Not on file   Highest education level: Not on file  Occupational History   Not on file  Tobacco Use   Smoking status: Never   Smokeless tobacco: Never  Substance and Sexual Activity   Alcohol use: No   Drug use: No   Sexual activity: Not on file  Other Topics Concern   Not on file  Social History Narrative   Not on file   Social Determinants of Health   Financial Resource Strain: Not on file  Food Insecurity: Not on file  Transportation Needs: Not on file  Physical Activity: Not on file  Stress: Not on file  Social Connections: Not on file  Intimate Partner Violence: Not on file    Review of Systems  HENT:         Nosebleed on 3/6 and 3/7  All other systems reviewed and are negative.       Objective    BP (!) 150/90   Pulse 68  Temp 98 F (36.7 C) (Oral)   Resp 18   Ht 5\' 11"  (1.803 m)   Wt 218 lb (98.9 kg)   SpO2 99%   BMI 30.40 kg/m   Physical Exam Vitals and nursing note reviewed.  Constitutional:      Appearance: Normal appearance. He is normal weight.  HENT:     Head: Normocephalic and atraumatic.     Right Ear: External ear normal.     Left Ear: External ear normal.     Nose: Nose normal.     Mouth/Throat:     Mouth: Mucous membranes are moist.  Eyes:     Extraocular Movements: Extraocular movements intact.     Conjunctiva/sclera: Conjunctivae normal.     Pupils: Pupils are equal, round, and reactive to light.  Cardiovascular:     Rate and Rhythm: Normal rate and regular rhythm.     Pulses: Normal pulses.     Heart sounds: Normal heart sounds.  Pulmonary:     Effort: Pulmonary effort is normal.     Breath sounds: Normal breath sounds.   Abdominal:     General: Abdomen is flat. Bowel sounds are normal.  Skin:    General: Skin is warm.     Capillary Refill: Capillary refill takes less than 2 seconds.  Neurological:     General: No focal deficit present.     Mental Status: He is alert and oriented to person, place, and time. Mental status is at baseline.     Cranial Nerves: Cranial nerve deficit present.     Motor: Weakness present.     Gait: Gait abnormal.  Psychiatric:        Mood and Affect: Mood normal.        Behavior: Behavior normal.        Thought Content: Thought content normal.        Judgment: Judgment normal.       Assessment & Plan:   Problem List Items Addressed This Visit   None Encounter to establish care with new doctor  Primary hypertension -     Losartan Potassium-HCTZ; Take 1 tablet by mouth daily.  Dispense: 30 tablet; Refill: 1   Pt didn't tolerate the ACEi causing cough. Will change regimen to Losartan-HCTZ 50/12.5mg  daily. Continue to monitor blood pressures and see back in 3 weeks for annual physical with fasting labs. Discussed nosebleeds can occur with elevated blood pressure but he only had 2 episodes. He is to stay on blood pressure medicine. No concerning or red flag symptoms today prompting further evaluation such as imaging. Will do labs in a few weeks with physical.  No follow-ups on file.   Leeanne Rio, MD

## 2022-04-13 ENCOUNTER — Other Ambulatory Visit: Payer: Self-pay | Admitting: Family Medicine

## 2022-04-13 DIAGNOSIS — I10 Essential (primary) hypertension: Secondary | ICD-10-CM

## 2022-04-17 ENCOUNTER — Encounter: Payer: Self-pay | Admitting: *Deleted

## 2022-04-18 ENCOUNTER — Ambulatory Visit: Payer: PRIVATE HEALTH INSURANCE | Admitting: Family Medicine

## 2022-04-21 ENCOUNTER — Ambulatory Visit (INDEPENDENT_AMBULATORY_CARE_PROVIDER_SITE_OTHER): Payer: Managed Care, Other (non HMO) | Admitting: Family Medicine

## 2022-04-21 ENCOUNTER — Encounter: Payer: Self-pay | Admitting: Family Medicine

## 2022-04-21 VITALS — BP 138/77 | HR 60 | Temp 97.9°F | Resp 18 | Ht 71.0 in | Wt 217.9 lb

## 2022-04-21 DIAGNOSIS — Z1211 Encounter for screening for malignant neoplasm of colon: Secondary | ICD-10-CM

## 2022-04-21 DIAGNOSIS — Z13220 Encounter for screening for lipoid disorders: Secondary | ICD-10-CM

## 2022-04-21 DIAGNOSIS — I10 Essential (primary) hypertension: Secondary | ICD-10-CM

## 2022-04-21 DIAGNOSIS — Z0000 Encounter for general adult medical examination without abnormal findings: Secondary | ICD-10-CM

## 2022-04-21 DIAGNOSIS — R7302 Impaired glucose tolerance (oral): Secondary | ICD-10-CM

## 2022-04-21 NOTE — Progress Notes (Signed)
Complete physical exam  Patient: Terry Rojas   DOB: Feb 07, 1960   62 y.o. Male  MRN: [id number]  Subjective:    Chief Complaint  Patient presents with   Annual Exam    Patient is here for his physical and states that his BP medication is really working good. He doesn't have any concerns at this time     Terry Rojas is a 62 y.o. male who presents today for a complete physical exam. He reports consuming a general diet. Home exercise routine includes in door rowing at home. He generally feels well. He reports sleeping well. He does have additional problems to discuss today.  Started on Losartan/hctz 50/12.5mg  daily a few weeks ago. He reported nose bleeds x 2 and headaches before starting the medicine which prompted initiation of antihypertensive. He's been checking at home and has been less than 140. Pt also reports no further nosebleeds or headaches. Time for colonoscopy and would like referral for this.  Most recent fall risk assessment:    03/20/2022    9:05 AM  Fall Risk   Falls in the past year? 0  Number falls in past yr: 0  Injury with Fall? 0  Risk for fall due to : No Fall Risks  Follow up Falls evaluation completed     Most recent depression screenings:    03/20/2022    9:07 AM 01/14/2018    9:00 AM  PHQ 2/9 Scores  PHQ - 2 Score 0 0  PHQ- 9 Score 0     Vision:Within last year  Patient Active Problem List   Diagnosis Date Noted   Steatosis of liver 03/20/2022   Olecranon bursitis of left elbow 04/13/2020   S/P bariatric surgery 05/24/2015   Snoring 04/25/2013   IFG (impaired fasting glucose) 11/01/2012   Obesity (BMI 35.0-39.9 without comorbidity) 03/03/2011   Essential hypertension, benign 03/03/2011   Past Medical History:  Diagnosis Date   Hypertension    Past Surgical History:  Procedure Laterality Date   BARIATRIC SURGERY  2016   left knee reconstruction  01/03/2008   Dr. Rennis Chris, total ACL reconstruction   SHOULDER SURGERY Right 03/14/2011    meniscal tear   Social History   Tobacco Use   Smoking status: Never   Smokeless tobacco: Never  Substance Use Topics   Alcohol use: No   Drug use: No      Patient Care Team: Suzan Slick, MD as PCP - General (Family Medicine)   Outpatient Medications Prior to Visit  Medication Sig   ALPHA LIPOIC ACID PO Take 600 mg by mouth 1 day or 1 dose.   losartan-hydrochlorothiazide (HYZAAR) 50-12.5 MG tablet TAKE 1 TABLET BY MOUTH EVERY DAY   No facility-administered medications prior to visit.    Review of Systems  All other systems reviewed and are negative.         Objective:     BP 138/77   Pulse 60   Temp 97.9 F (36.6 C) (Oral)   Resp 18   Ht  (1.803 m)   Wt 217 lb 14.4 oz (98.8 kg)   SpO2 99%   BMI 30.39 kg/m  BP Readings from Last 3 Encounters:  04/21/22 138/77  03/20/22 (!) 146/80  04/13/20 120/74      Physical Exam Vitals and nursing note reviewed.  Constitutional:      Appearance: Normal appearance. He is normal weight.  HENT:     Head: Normocephalic.     Right Ear: Tympanic  membrane and external ear normal.     Nose: Nose normal.     Mouth/Throat:     Mouth: Mucous membranes are moist.     Pharynx: Oropharynx is clear.  Eyes:     Conjunctiva/sclera: Conjunctivae normal.     Pupils: Pupils are equal, round, and reactive to light.  Cardiovascular:     Rate and Rhythm: Normal rate and regular rhythm.     Pulses: Normal pulses.     Heart sounds: Normal heart sounds.  Pulmonary:     Effort: Pulmonary effort is normal.     Breath sounds: Normal breath sounds.  Abdominal:     General: Abdomen is flat. Bowel sounds are normal.  Skin:    General: Skin is warm.     Capillary Refill: Capillary refill takes less than 2 seconds.  Neurological:     Mental Status: He is alert and oriented to person, place, and time. Mental status is at baseline.  Psychiatric:        Mood and Affect: Mood normal.        Behavior: Behavior normal.         Thought Content: Thought content normal.        Judgment: Judgment normal.     No results found for any visits on 04/21/22.      Assessment & Plan:    Routine Health Maintenance and Physical Exam  Immunization History  Administered Date(s) Administered   Tdap 05/24/2015    Health Maintenance  Topic Date Due   COVID-19 Vaccine (1) Never done   Zoster Vaccines- Shingrix (1 of 2) Never done   COLONOSCOPY (Pts 45-64yrs Insurance coverage will need to be confirmed)  12/24/2017   INFLUENZA VACCINE  08/03/2022   DTaP/Tdap/Td (2 - Td or Tdap) 05/23/2025   Hepatitis C Screening  Completed   HIV Screening  Completed   HPV VACCINES  Aged Out    Discussed health benefits of physical activity, and encouraged him to engage in regular exercise appropriate for his age and condition.  Problem List Items Addressed This Visit   None  No follow-ups on file. Annual physical exam  Encounter for lipid screening for cardiovascular disease -     Lipid panel  Impaired glucose tolerance -     CBC with Differential/Platelet -     Comprehensive metabolic panel -     Hemoglobin A1c  Essential hypertension, benign  Screening for colon cancer -     Ambulatory referral to Gastroenterology   Screening labs Continue Losartan 50/12.5mg  daily for HTN  Refer for colonoscopy.    Suzan Slick, MD

## 2022-04-22 LAB — COMPREHENSIVE METABOLIC PANEL
ALT: 57 IU/L — ABNORMAL HIGH (ref 0–44)
AST: 37 IU/L (ref 0–40)
Albumin/Globulin Ratio: 2 (ref 1.2–2.2)
Albumin: 4.4 g/dL (ref 3.8–4.9)
Alkaline Phosphatase: 56 IU/L (ref 44–121)
BUN/Creatinine Ratio: 22 (ref 10–24)
BUN: 23 mg/dL (ref 8–27)
Bilirubin Total: 1.2 mg/dL (ref 0.0–1.2)
CO2: 26 mmol/L (ref 20–29)
Calcium: 9.2 mg/dL (ref 8.6–10.2)
Chloride: 103 mmol/L (ref 96–106)
Creatinine, Ser: 1.05 mg/dL (ref 0.76–1.27)
Globulin, Total: 2.2 g/dL (ref 1.5–4.5)
Glucose: 102 mg/dL — ABNORMAL HIGH (ref 70–99)
Potassium: 4.1 mmol/L (ref 3.5–5.2)
Sodium: 142 mmol/L (ref 134–144)
Total Protein: 6.6 g/dL (ref 6.0–8.5)
eGFR: 81 mL/min/{1.73_m2} (ref >59)

## 2022-04-22 LAB — LIPID PANEL
Chol/HDL Ratio: 4.4 ratio (ref 0.0–5.0)
Cholesterol, Total: 168 mg/dL (ref 100–199)
HDL: 38 mg/dL — ABNORMAL LOW (ref >39)
LDL Chol Calc (NIH): 111 mg/dL — ABNORMAL HIGH (ref 0–99)
Triglycerides: 105 mg/dL (ref 0–149)
VLDL Cholesterol Cal: 19 mg/dL (ref 5–40)

## 2022-04-22 LAB — CBC WITH DIFFERENTIAL/PLATELET
Basophils Absolute: 0 10*3/uL (ref 0.0–0.2)
Basos: 1 %
EOS (ABSOLUTE): 0.1 10*3/uL (ref 0.0–0.4)
Eos: 2 %
Hematocrit: 43.4 % (ref 37.5–51.0)
Hemoglobin: 14.5 g/dL (ref 13.0–17.7)
Immature Grans (Abs): 0 10*3/uL (ref 0.0–0.1)
Immature Granulocytes: 0 %
Lymphocytes Absolute: 1.7 10*3/uL (ref 0.7–3.1)
Lymphs: 32 %
MCH: 31.7 pg (ref 26.6–33.0)
MCHC: 33.4 g/dL (ref 31.5–35.7)
MCV: 95 fL (ref 79–97)
Monocytes Absolute: 0.3 10*3/uL (ref 0.1–0.9)
Monocytes: 6 %
Neutrophils Absolute: 3.2 10*3/uL (ref 1.4–7.0)
Neutrophils: 59 %
Platelets: 185 10*3/uL (ref 150–450)
RBC: 4.57 x10E6/uL (ref 4.14–5.80)
RDW: 12.5 % (ref 11.6–15.4)
WBC: 5.3 10*3/uL (ref 3.4–10.8)

## 2022-04-22 LAB — HEMOGLOBIN A1C
Est. average glucose Bld gHb Est-mCnc: 126 mg/dL
Hgb A1c MFr Bld: 6 % — ABNORMAL HIGH (ref 4.8–5.6)

## 2022-10-12 ENCOUNTER — Other Ambulatory Visit: Payer: Self-pay | Admitting: Family Medicine

## 2022-10-12 DIAGNOSIS — I10 Essential (primary) hypertension: Secondary | ICD-10-CM

## 2023-04-12 ENCOUNTER — Other Ambulatory Visit: Payer: Self-pay | Admitting: Family Medicine

## 2023-04-12 DIAGNOSIS — I10 Essential (primary) hypertension: Secondary | ICD-10-CM

## 2023-04-24 ENCOUNTER — Encounter: Payer: Managed Care, Other (non HMO) | Admitting: Family Medicine

## 2023-06-04 ENCOUNTER — Ambulatory Visit
Admission: RE | Admit: 2023-06-04 | Discharge: 2023-06-04 | Disposition: A | Discharge status: Home / Self Care | Source: Ambulatory Visit | Attending: Chiropractic Medicine | Admitting: Chiropractic Medicine

## 2023-06-04 ENCOUNTER — Encounter: Payer: Self-pay | Admitting: Chiropractic Medicine

## 2023-06-04 ENCOUNTER — Other Ambulatory Visit: Payer: Self-pay | Admitting: Chiropractic Medicine

## 2023-06-04 DIAGNOSIS — M25511 Pain in right shoulder: Secondary | ICD-10-CM

## 2023-06-11 ENCOUNTER — Other Ambulatory Visit: Payer: Self-pay

## 2023-06-11 DIAGNOSIS — M7541 Impingement syndrome of right shoulder: Secondary | ICD-10-CM

## 2023-07-30 ENCOUNTER — Ambulatory Visit
Admission: RE | Admit: 2023-07-30 | Discharge: 2023-07-30 | Disposition: A | Discharge status: Home / Self Care | Source: Ambulatory Visit

## 2023-07-30 DIAGNOSIS — M7541 Impingement syndrome of right shoulder: Secondary | ICD-10-CM

## 2023-10-11 ENCOUNTER — Other Ambulatory Visit: Payer: Self-pay | Admitting: Family Medicine

## 2023-10-11 DIAGNOSIS — I10 Essential (primary) hypertension: Secondary | ICD-10-CM

## 2023-12-17 ENCOUNTER — Other Ambulatory Visit: Payer: Self-pay | Admitting: Family Medicine

## 2023-12-17 DIAGNOSIS — I10 Essential (primary) hypertension: Secondary | ICD-10-CM

## 2023-12-17 MED ORDER — LOSARTAN POTASSIUM-HCTZ 50-12.5 MG PO TABS: 1.0000 | ORAL_TABLET | Freq: Every day | ORAL | 0 refills | Status: DC

## 2023-12-17 NOTE — Telephone Encounter (Signed)
 Copied from CRM [id number]. Topic: Clinical - Medication Refill >> Dec 17, 2023  8:12 AM Antwanette L wrote: Medication: losartan -hydrochlorothiazide  (HYZAAR) 50-12.5 MG tablet  Has the patient contacted their pharmacy? Yes  This is the patient's preferred pharmacy:  [phone] - [place], [place] - [address] [place] [address] Phone: [phone] Fax: [phone]    Is this the correct pharmacy for this prescription? Yes   Has the prescription been filled recently? Yes. Last refill was on 04/12/23  Is the patient out of the medication? Yes  Has the patient been seen for an appointment in the last year OR does the patient have an upcoming appointment? Yes. Last ov appt with Dr. Colette was on 04/21/22 (physical) and next appt is on 04/21/24  Can we respond through MyChart? Yes  Agent: Please be advised that Rx refills may take up to 3 business days. We ask that you follow-up with your pharmacy.

## 2023-12-17 NOTE — Telephone Encounter (Signed)
 Patient has scheduled an appointment for 01/21/2024 at 3:10 with Dr. Colette.

## 2024-01-21 ENCOUNTER — Ambulatory Visit: Admitting: Family Medicine

## 2024-01-22 ENCOUNTER — Ambulatory Visit: Admitting: Family Medicine

## 2024-01-22 VITALS — BP 135/65 | HR 66 | Ht 71.0 in | Wt 225.0 lb

## 2024-01-22 DIAGNOSIS — Z23 Encounter for immunization: Secondary | ICD-10-CM | POA: Diagnosis not present

## 2024-01-22 DIAGNOSIS — I10 Essential (primary) hypertension: Secondary | ICD-10-CM | POA: Diagnosis not present

## 2024-01-22 MED ORDER — LOSARTAN POTASSIUM-HCTZ 100-12.5 MG PO TABS: 1.0000 | ORAL_TABLET | Freq: Every day | ORAL | 0 refills | Status: AC

## 2024-01-22 NOTE — Progress Notes (Signed)
 "  Established Patient Office Visit  Subjective   Patient ID: Terry Rojas, male    DOB: June 20, 1960  Age: 64 y.o. MRN: [id number]  Chief Complaint  Patient presents with   Medication Refill    Not fasting - would like blood work okay with coming back fasting if necessary Would like to check testosterone levels - tired more than usual / when working out feeling more sluggish- libito has diminished a bit Would like to check PSA - Family hx of prostate cancer - dad - has not had colonoscopy recently    Medication Refill    Hypertension Pt here for HTN follow up. He's taking Hyzaar 50/12.5mg  daily. He's checking bps at home on wrist monitor in the last week. He has been out of his medicine for the last week. He reports elevated Bp's even when on the medicine with SBP to 150s.  He is feeling fatigue and decreased libido with ED. He would like testosterone checked. He would also like PSA checked due to family hx of prostate cancer in his father. He's doing well.   He would like flu vaccine today.   Review of Systems  All other systems reviewed and are negative.     Objective:     BP (!) 153/71 (BP Location: Left Arm, Patient Position: Sitting, Cuff Size: Normal)   Pulse 66   Ht 5' 11 (1.803 m)   Wt 225 lb (102.1 kg)   SpO2 99%   BMI 31.38 kg/m  BP Readings from Last 3 Encounters:  01/22/24 (!) 153/71  04/21/22 138/77  03/20/22 (!) 146/80      Physical Exam Vitals and nursing note reviewed.  Constitutional:      Appearance: Normal appearance. He is normal weight.  HENT:     Head: Normocephalic and atraumatic.     Right Ear: External ear normal.     Left Ear: External ear normal.     Nose: Nose normal.     Mouth/Throat:     Mouth: Mucous membranes are moist.     Pharynx: Oropharynx is clear.  Eyes:     Conjunctiva/sclera: Conjunctivae normal.     Pupils: Pupils are equal, round, and reactive to light.  Cardiovascular:     Rate and Rhythm: Normal rate and regular  rhythm.     Pulses: Normal pulses.     Heart sounds: Normal heart sounds.  Pulmonary:     Effort: Pulmonary effort is normal.     Breath sounds: Normal breath sounds.  Skin:    General: Skin is warm.     Capillary Refill: Capillary refill takes less than 2 seconds.  Neurological:     General: No focal deficit present.     Mental Status: He is alert and oriented to person, place, and time. Mental status is at baseline.  Psychiatric:        Mood and Affect: Mood normal.        Behavior: Behavior normal.        Thought Content: Thought content normal.        Judgment: Judgment normal.      No results found for any visits on 01/22/24.     The 10-year ASCVD risk score (Arnett DK, et al., 2019) is: 16.4%    Assessment & Plan:   Problem List Items Addressed This Visit   None Visit Diagnoses       Primary hypertension    -  Primary   Relevant Medications   losartan -hydrochlorothiazide  (HYZAAR)  100-12.5 MG tablet     Immunization due       Relevant Orders   Flu vaccine trivalent PF, 6mos and older(Flulaval,Afluria,Fluarix,Fluzone) (Completed)      Primary hypertension -     Losartan  Potassium-HCTZ; Take 1 tablet by mouth daily.  Dispense: 90 tablet; Refill: 0  Immunization due -     Flu vaccine trivalent PF, 6mos and older(Flulaval,Afluria,Fluarix,Fluzone)   Pt with HTN not controlled. Increase Losartan /hydrochlorothiazide  50/12.5mg  to 100/12.5mg  daily.  Flu vaccine today See back in 4 weeks for CPE and HTN f/u. Return in about 4 weeks (around 02/19/2024) for Annual Physical.    Torrence CINDERELLA Barrier, MD  "

## 2024-01-23 NOTE — Progress Notes (Signed)
 The insurance card has been uploaded.   Thank you.  Abigail Teall [facility] Patient Access Advocate II

## 2024-02-19 ENCOUNTER — Encounter: Admitting: Family Medicine

## 2024-04-21 ENCOUNTER — Encounter: Admitting: Family Medicine
